# Patient Record
Sex: Female | Born: 1985 | Hispanic: Yes | Marital: Married | Smoking: Never smoker
Health system: Southern US, Community
[De-identification: ages and names within clinical notes are randomized; demographics above are authoritative.]

## PROBLEM LIST (undated history)

## (undated) ENCOUNTER — Inpatient Hospital Stay (HOSPITAL_COMMUNITY): Payer: Self-pay

## (undated) DIAGNOSIS — B977 Papillomavirus as the cause of diseases classified elsewhere: Secondary | ICD-10-CM

## (undated) HISTORY — PX: COLPOSCOPY: SHX161

---

## 2014-09-06 ENCOUNTER — Inpatient Hospital Stay (HOSPITAL_COMMUNITY): Payer: Self-pay

## 2014-09-06 ENCOUNTER — Encounter (HOSPITAL_COMMUNITY): Payer: Self-pay

## 2014-09-06 ENCOUNTER — Inpatient Hospital Stay (HOSPITAL_COMMUNITY)
Admission: AD | Admit: 2014-09-06 | Discharge: 2014-09-06 | Disposition: A | Discharge status: Home / Self Care | Payer: Self-pay | Source: Ambulatory Visit | Attending: Obstetrics & Gynecology | Admitting: Obstetrics & Gynecology

## 2014-09-06 DIAGNOSIS — O209 Hemorrhage in early pregnancy, unspecified: Secondary | ICD-10-CM | POA: Insufficient documentation

## 2014-09-06 DIAGNOSIS — K5900 Constipation, unspecified: Secondary | ICD-10-CM | POA: Insufficient documentation

## 2014-09-06 DIAGNOSIS — Z3A01 Less than 8 weeks gestation of pregnancy: Secondary | ICD-10-CM | POA: Insufficient documentation

## 2014-09-06 HISTORY — DX: Papillomavirus as the cause of diseases classified elsewhere: B97.7

## 2014-09-06 LAB — WET PREP, GENITAL
Clue Cells Wet Prep HPF POC: NONE SEEN
TRICH WET PREP: NONE SEEN
Yeast Wet Prep HPF POC: NONE SEEN

## 2014-09-06 LAB — CBC WITH DIFFERENTIAL/PLATELET
BASOS ABS: 0 10*3/uL (ref 0.0–0.1)
BASOS PCT: 0 % (ref 0–1)
Eosinophils Absolute: 1.1 10*3/uL — ABNORMAL HIGH (ref 0.0–0.7)
Eosinophils Relative: 10 % — ABNORMAL HIGH (ref 0–5)
HCT: 37.2 % (ref 36.0–46.0)
Hemoglobin: 12.9 g/dL (ref 12.0–15.0)
LYMPHS PCT: 26 % (ref 12–46)
Lymphs Abs: 3 10*3/uL (ref 0.7–4.0)
MCH: 29.9 pg (ref 26.0–34.0)
MCHC: 34.7 g/dL (ref 30.0–36.0)
MCV: 86.3 fL (ref 78.0–100.0)
Monocytes Absolute: 0.7 10*3/uL (ref 0.1–1.0)
Monocytes Relative: 6 % (ref 3–12)
NEUTROS PCT: 58 % (ref 43–77)
Neutro Abs: 6.9 10*3/uL (ref 1.7–7.7)
PLATELETS: 223 10*3/uL (ref 150–400)
RBC: 4.31 MIL/uL (ref 3.87–5.11)
RDW: 12.8 % (ref 11.5–15.5)
WBC: 11.8 10*3/uL — AB (ref 4.0–10.5)

## 2014-09-06 LAB — HCG, QUANTITATIVE, PREGNANCY: HCG, BETA CHAIN, QUANT, S: 5223 m[IU]/mL — AB (ref <5)

## 2014-09-06 LAB — URINALYSIS, ROUTINE W REFLEX MICROSCOPIC
BILIRUBIN URINE: NEGATIVE
Glucose, UA: NEGATIVE mg/dL
Ketones, ur: NEGATIVE mg/dL
Nitrite: NEGATIVE
PH: 5.5 (ref 5.0–8.0)
Protein, ur: NEGATIVE mg/dL
UROBILINOGEN UA: 0.2 mg/dL (ref 0.0–1.0)

## 2014-09-06 LAB — URINE MICROSCOPIC-ADD ON

## 2014-09-06 LAB — POCT PREGNANCY, URINE: PREG TEST UR: POSITIVE — AB

## 2014-09-06 NOTE — MAU Note (Signed)
Pt presents complaining of lower abdominal pain and spotting that started 2 months ago. Seen at [facility] 6/15 and was told to come to MAU if it gets worse. LMP 07/09/2014.

## 2014-09-06 NOTE — Discharge Instructions (Signed)
Reposo pélvico  °(Pelvic Rest) °El reposo pélvico se recomienda a las mujeres cuando:  °· La placenta cubre parcial o completamente la abertura del cuello del útero (placenta previa). °· Hay sangrado entre la pared del útero y el saco amniótico en el primer trimestre (hemorragia subcoriónica). °· El cuello uterino comienza a abrirse sin iniciarse el trabajo de parto (cuello uterino incompetente, insuficiencia cervical). °· El trabajo de parto se inicia muy pronto (parto prematuro). °INSTRUCCIONES PARA EL CUIDADO EN EL HOGAR  °· No tenga relaciones sexuales, estimulación, ni orgasmos. °· No use tampones, no se haga duchas vaginales ni coloque ningún objeto en la vagina. °· No levante objetos que pesen más de 10 libras (4,5 kg). °· Evite las actividades extenuantes o tensionar los músculos de la pelvis. °SOLICITE ATENCIÓN MÉDICA SI:   °· Tiene un sangrado vaginal durante el embarazo. Considérelo como una posible emergencia. °· Siente cólicos en la zona baja del estómago (más fuertes que los cólicos menstruales). °· Nota flujo vaginal (acuoso, con moco o sangre). °· Siente un dolor en la espalda leve y sordo. °· Tiene contracciones regulares o endurecimiento del útero. °SOLICITE ATENCIÓN MÉDICA DE INMEDIATO SI:  °Observa sangrado vaginal y tiene placenta previa.  °Document Released: 11/21/2011 °ExitCare® Patient Information ©2015 [employer]. This information is not intended to replace advice given to you by your health care provider. Make sure you discuss any questions you have with your health care provider. °Hemorragia vaginal durante el embarazo (primer trimestre) °(Vaginal Bleeding During Pregnancy, First Trimester) °Durante los primeros meses de embarazo, es común tener una pequeña hemorragia vaginal (manchas). A veces, la hemorragia es normal y no representa un problema, pero en algunas ocasiones es un síntoma de algo grave. Asegúrese de decirle a su médico de inmediato si tiene algún tipo de hemorragia  vaginal. °CUIDADOS EN EL HOGAR °· Controle su afección para ver si hay cambios. °· Siga las indicaciones de su médico con respecto al grado de actividad que puede tener. °· Si debe hacer reposo en cama: °· Es posible que deba quedarse en cama y levantarse únicamente para ir al baño. °· Quizás le permitan hacer algunas actividades. °· Si es necesario, planifique que alguien la ayude. °· Escriba: °· La cantidad de toallas higiénicas que usa cada día. °· La frecuencia con la que se cambia las toallas higiénicas. °· Indique que tan empapados (saturados) están. °· No use tampones. °· No se haga duchas vaginales. °· No tenga relaciones sexuales ni orgasmos hasta que el médico la autorice. °· Si elimina tejido por la vagina, guárdelo para mostrárselo al médico. °· Tome los medicamentos solamente como se lo haya indicado el médico. °· No tome aspirina, ya que puede causar hemorragias. °· Concurra a todas las visitas de control como se lo haya indicado el médico. °SOLICITE AYUDA SI:  °· Tiene una hemorragia vaginal. °· Tiene cólicos. °· Tiene dolores de parto. °· Tiene fiebre que no desaparece después de tomar medicamentos. °SOLICITE AYUDA DE INMEDIATO SI:  °· Siente cólicos muy intensos en la espalda o en el vientre (abdomen). °· Elimina coágulos grandes o tejido por la vagina. °· Tiene más hemorragia. °· Se siente débil o que va a desvanecerse. °· Pierde el conocimiento (se desmaya). °· Tiene escalofríos. °· Tiene una pérdida importante o sale líquido a borbotones por la vagina. °· Se desmaya mientras defeca. °ASEGÚRESE DE QUE: °· Comprende estas instrucciones. °· Controlará su afección. °· Recibirá ayuda de inmediato si no mejora o si empeora. °Document Released: 07/13/2013 °ExitCare® Patient   Information ©2015 [employer]. This information is not intended to replace advice given to you by your health care provider. Make sure you discuss any questions you have with your health care provider. ° °

## 2014-09-06 NOTE — MAU Provider Note (Signed)
History     CSN: [id number]  Arrival date and time: 09/06/14 2014   First Provider Initiated Contact with Patient 09/06/14 2046      Chief Complaint  Patient presents with  . Abdominal Pain  . Vaginal Bleeding   HPI  Ms. Baltazar Apolsa U Martinez is a 29 y.o. G3P2002 at 5852w3d who presents to MAU today with complaint of vaginal bleeding and lower abdominal cramping. The states that bleeding started yesterday. She noted bleeding only with wiping. She also endorses associated lower abdominal cramping rated at 4/10 now. She has not taken any pain medication. She denies UTI symptoms, fever or N/V/D. She does endorse occasional constipation.   OB History    Gravida Para Term Preterm AB TAB SAB Ectopic Multiple Living   3 2 2       2       Past Medical History  Diagnosis Date  . HPV (human papilloma virus) infection     History reviewed. No pertinent past surgical history.  History reviewed. No pertinent family history.  History  Substance Use Topics  . Smoking status: Never Smoker   . Smokeless tobacco: Not on file  . Alcohol Use: No    Allergies: No Known Allergies  No prescriptions prior to admission    Review of Systems  Constitutional: Negative for fever and malaise/fatigue.  Gastrointestinal: Positive for abdominal pain. Negative for nausea, vomiting, diarrhea and constipation.  Genitourinary: Negative for dysuria, urgency and frequency.       + vaginal bleeding   Physical Exam   Blood pressure 122/70, pulse 71, temperature 98 F (36.7 C), temperature source Oral, resp. rate 18, last menstrual period 07/09/2014.  Physical Exam  Nursing note and vitals reviewed. Constitutional: She is oriented to person, place, and time. She appears well-developed and well-nourished. No distress.  HENT:  Head: Normocephalic and atraumatic.  Cardiovascular: Normal rate.   Respiratory: Effort normal.  GI: Soft. She exhibits no distension and no mass. There is tenderness (mild diffuse  tenderness to palpation). There is no rebound and no guarding.  Genitourinary: Uterus is enlarged and tender (mild). Cervix exhibits no motion tenderness, no discharge and no friability. Right adnexum displays no mass and no tenderness. Left adnexum displays no mass and no tenderness. There is bleeding (scant blood noted in the vaginal vault) in the vagina. No vaginal discharge found.  Cervix: closed, thick  Neurological: She is alert and oriented to person, place, and time.  Skin: Skin is warm and dry. No erythema.  Psychiatric: She has a normal mood and affect.   Results for orders placed or performed during the hospital encounter of 09/06/14 (from the past 24 hour(s))  Urinalysis, Routine w reflex microscopic ([facility])     Status: Abnormal   Collection Time: 09/06/14  8:34 PM  Result Value Ref Range   Color, Urine YELLOW YELLOW   APPearance CLEAR CLEAR   Specific Gravity, Urine <1.005 (L) 1.005 - 1.030   pH 5.5 5.0 - 8.0   Glucose, UA NEGATIVE NEGATIVE mg/dL   Hgb urine dipstick MODERATE (A) NEGATIVE   Bilirubin Urine NEGATIVE NEGATIVE   Ketones, ur NEGATIVE NEGATIVE mg/dL   Protein, ur NEGATIVE NEGATIVE mg/dL   Urobilinogen, UA 0.2 0.0 - 1.0 mg/dL   Nitrite NEGATIVE NEGATIVE   Leukocytes, UA SMALL (A) NEGATIVE  Urine microscopic-add on     Status: Abnormal   Collection Time: 09/06/14  8:34 PM  Result Value Ref Range   Squamous Epithelial / LPF FEW (A)  RARE   WBC, UA 3-6 <3 WBC/hpf   RBC / HPF 0-2 <3 RBC/hpf   Bacteria, UA FEW (A) RARE  Pregnancy, urine POC     Status: Abnormal   Collection Time: 09/06/14  8:41 PM  Result Value Ref Range   Preg Test, Ur POSITIVE (A) NEGATIVE  Wet prep, genital     Status: Abnormal   Collection Time: 09/06/14  8:55 PM  Result Value Ref Range   Yeast Wet Prep HPF POC NONE SEEN NONE SEEN   Trich, Wet Prep NONE SEEN NONE SEEN   Clue Cells Wet Prep HPF POC NONE SEEN NONE SEEN   WBC, Wet Prep HPF POC MODERATE (A) NONE SEEN  CBC with  Differential/Platelet     Status: Abnormal   Collection Time: 09/06/14  9:00 PM  Result Value Ref Range   WBC 11.8 (H) 4.0 - 10.5 K/uL   RBC 4.31 3.87 - 5.11 MIL/uL   Hemoglobin 12.9 12.0 - 15.0 g/dL   HCT 16.1 09.6 - 04.5 %   MCV 86.3 78.0 - 100.0 fL   MCH 29.9 26.0 - 34.0 pg   MCHC 34.7 30.0 - 36.0 g/dL   RDW 40.9 81.1 - 91.4 %   Platelets 223 150 - 400 K/uL   Neutrophils Relative % 58 43 - 77 %   Neutro Abs 6.9 1.7 - 7.7 K/uL   Lymphocytes Relative 26 12 - 46 %   Lymphs Abs 3.0 0.7 - 4.0 K/uL   Monocytes Relative 6 3 - 12 %   Monocytes Absolute 0.7 0.1 - 1.0 K/uL   Eosinophils Relative 10 (H) 0 - 5 %   Eosinophils Absolute 1.1 (H) 0.0 - 0.7 K/uL   Basophils Relative 0 0 - 1 %   Basophils Absolute 0.0 0.0 - 0.1 K/uL  ABO/Rh     Status: None (Preliminary result)   Collection Time: 09/06/14  9:00 PM  Result Value Ref Range   ABO/RH(D) O POS   hCG, quantitative, pregnancy     Status: Abnormal   Collection Time: 09/06/14  9:00 PM  Result Value Ref Range   hCG, Beta Chain, Quant, S 5223 (H) <5 mIU/mL   US Ob Comp Less 14 Wks  09/06/2014   CLINICAL DATA:  Bleeding and lower abdominal pain, onset yesterday  EXAM: OBSTETRIC <14 WK [place] AND TRANSVAGINAL OB US  TECHNIQUE: Both transabdominal and transvaginal ultrasound examinations were performed for complete evaluation of the gestation as well as the maternal uterus, adnexal regions, and pelvic cul-de-sac. Transvaginal technique was performed to assess early pregnancy.  COMPARISON:  None.  FINDINGS: Intrauterine gestational sac: Single. Mildly irregular morphology. Poor decidual reaction.  Yolk sac:  Enlarged, 10.1 mm  Embryo:  Yes  Cardiac Activity: No  MSD: 18.4  mm   6 w   6  d  CRL:  4  mm   6 w   1 d                  [place] EDC: 05/01/2015  Maternal uterus/adnexae: Normal  IMPRESSION: Findings are suspicious but not yet definitive for failed pregnancy. Recommend follow-up US in 10-14 days for definitive diagnosis. This recommendation follows  SRU consensus guidelines: Diagnostic Criteria for Nonviable Pregnancy Early in the First Trimester. Malva Limes Med 2013; 782:9562-13.   Electronically Signed   By: Ellery Plunk M.D.   On: 09/06/2014 22:05   US Ob Transvaginal  09/06/2014   CLINICAL DATA:  Bleeding and lower abdominal pain,  onset yesterday  EXAM: OBSTETRIC <14 WK [place] AND TRANSVAGINAL OB US  TECHNIQUE: Both transabdominal and transvaginal ultrasound examinations were performed for complete evaluation of the gestation as well as the maternal uterus, adnexal regions, and pelvic cul-de-sac. Transvaginal technique was performed to assess early pregnancy.  COMPARISON:  None.  FINDINGS: Intrauterine gestational sac: Single. Mildly irregular morphology. Poor decidual reaction.  Yolk sac:  Enlarged, 10.1 mm  Embryo:  Yes  Cardiac Activity: No  MSD: 18.4  mm   6 w   6  d  CRL:  4  mm   6 w   1 d                  [place] EDC: 05/01/2015  Maternal uterus/adnexae: Normal  IMPRESSION: Findings are suspicious but not yet definitive for failed pregnancy. Recommend follow-up US in 10-14 days for definitive diagnosis. This recommendation follows SRU consensus guidelines: Diagnostic Criteria for Nonviable Pregnancy Early in the First Trimester. Malva Limes Med 2013; 409:8119-14.   Electronically Signed   By: Ellery Plunk M.D.   On: 09/06/2014 22:05    MAU Course  Procedures None  MDM +UPT UA, wet prep, GC/chlamydia, CBC, ABO/Rh, quant hCG, HIV, RPR and [place] today to rule out ectopic pregnancy  Assessment and Plan  A: IUGS and YS at [redacted]w[redacted]d Vaginal bleeding in pregnancy Constipation  P: Discharge home Bleeding precautions discussed OTC medications for constipation discussed Patient advised to follow-up with [facility]- [place] for repeat US to confirm viability vs Missed AB on 09/15/14. Patient will return to MAU for results after [place] Patient may return to MAU as needed or if her condition were to change or worsen   Marny Lowenstein, PA-C  09/06/2014, 10:16 PM

## 2014-09-07 ENCOUNTER — Encounter (HOSPITAL_COMMUNITY): Payer: Self-pay

## 2014-09-07 ENCOUNTER — Inpatient Hospital Stay (HOSPITAL_COMMUNITY)
Admission: AD | Admit: 2014-09-07 | Discharge: 2014-09-08 | Disposition: A | Discharge status: Home / Self Care | Payer: Self-pay | Source: Ambulatory Visit | Attending: Family Medicine | Admitting: Family Medicine

## 2014-09-07 DIAGNOSIS — O034 Incomplete spontaneous abortion without complication: Secondary | ICD-10-CM | POA: Insufficient documentation

## 2014-09-07 DIAGNOSIS — O4690 Antepartum hemorrhage, unspecified, unspecified trimester: Secondary | ICD-10-CM

## 2014-09-07 DIAGNOSIS — O039 Complete or unspecified spontaneous abortion without complication: Secondary | ICD-10-CM

## 2014-09-07 LAB — CBC WITH DIFFERENTIAL/PLATELET
BASOS PCT: 0 % (ref 0–1)
Basophils Absolute: 0 10*3/uL (ref 0.0–0.1)
EOS ABS: 1.2 10*3/uL — AB (ref 0.0–0.7)
Eosinophils Relative: 9 % — ABNORMAL HIGH (ref 0–5)
HCT: 37.2 % (ref 36.0–46.0)
Hemoglobin: 12.8 g/dL (ref 12.0–15.0)
Lymphocytes Relative: 22 % (ref 12–46)
Lymphs Abs: 3.1 10*3/uL (ref 0.7–4.0)
MCH: 29.8 pg (ref 26.0–34.0)
MCHC: 34.4 g/dL (ref 30.0–36.0)
MCV: 86.5 fL (ref 78.0–100.0)
MONO ABS: 1.1 10*3/uL — AB (ref 0.1–1.0)
MONOS PCT: 8 % (ref 3–12)
NEUTROS PCT: 62 % (ref 43–77)
Neutro Abs: 8.5 10*3/uL — ABNORMAL HIGH (ref 1.7–7.7)
Platelets: 236 10*3/uL (ref 150–400)
RBC: 4.3 MIL/uL (ref 3.87–5.11)
RDW: 13 % (ref 11.5–15.5)
WBC: 13.9 10*3/uL — ABNORMAL HIGH (ref 4.0–10.5)

## 2014-09-07 LAB — GC/CHLAMYDIA PROBE AMP (~~LOC~~) NOT AT ARMC
Chlamydia: NEGATIVE
NEISSERIA GONORRHEA: NEGATIVE

## 2014-09-07 LAB — RPR: RPR Ser Ql: NONREACTIVE

## 2014-09-07 LAB — HIV ANTIBODY (ROUTINE TESTING W REFLEX): HIV Screen 4th Generation wRfx: NONREACTIVE

## 2014-09-07 LAB — HCG, QUANTITATIVE, PREGNANCY: HCG, BETA CHAIN, QUANT, S: 3642 m[IU]/mL — AB (ref <5)

## 2014-09-07 LAB — ABO/RH: ABO/RH(D): O POS

## 2014-09-07 MED ORDER — OXYCODONE-ACETAMINOPHEN 5-325 MG PO TABS: 1.0000 | ORAL_TABLET | Freq: Four times a day (QID) | ORAL | Status: DC | PRN

## 2014-09-07 NOTE — Discharge Instructions (Signed)
Aborto incompleto °(Incomplete Miscarriage) °Un aborto espontáneo es la pérdida repentina de un bebé en gestación (feto) antes de la semana 20 del embarazo. En un aborto espontáneo, partes del feto o la placenta (alumbramiento) permanecen en el cuerpo.  °El aborto espontáneo puede ser una experiencia que afecte emocionalmente a la persona. Hable con su médico si tiene preguntas sobre el aborto espontáneo, el proceso de duelo y los planes futuros de embarazo. °CAUSAS  °· Algunos problemas cromosómicos pueden hacer imposible que el bebé se desarrolle normalmente. Los problemas con los genes o cromosomas del bebé son, en la mayoría de los casos, el resultado de errores que se producen, al azar, cuando el embrión se divide y crece. Estos problemas no se heredan de los padres. °· Infección en el cuello del útero. °· Problemas hormonales. °· Problemas en el cuello del útero, como tener un útero incompetente. Esto ocurre cuando los tejidos no son lo suficientemente fuertes como para contener el embarazo. °· Problemas del útero, como un útero con forma anormal, los fibromas o anormalidades congénitas. °· Ciertas enfermedades crónicas. °· No fume, no beba alcohol, ni consuma drogas. °· Traumatismos. °SÍNTOMAS  °· Sangrado o manchado vaginal, con o sin cólicos o dolor. °· Dolor o cólicos en el abdomen o en la cintura. °· Eliminación de líquido, tejidos o coágulos grandes por la vagina. °DIAGNÓSTICO  °El médico le hará un examen físico. También le indicará una ecografía para confirmar el aborto. Es posible que se realicen análisis de sangre. °TRATAMIENTO  °· Generalmente se realiza un procedimiento de dilatación y curetaje (D y C). Durante el procedimiento de dilatación y curetaje, el cuello del útero se abre (dilata) y se retira todo resto de tejido fetal o placentario del útero. °· Si hay una infección, le recetarán antibióticos. Posiblemente le receten otros medicamentos para reducir (contraer) el tamaño del útero si hay  mucha hemorragia. °· Si su tipo de sangre es Rh negativo y el del bebé es Rh positivo, necesitará una inyección de inmunoglobulina Rho(D). Esta inyección protegerá a los futuros bebés de tener problemas de compatibilidad Rh en futuros embarazos. °· Probablemente le indiquen reposo. Esto significa que debe quedarse en cama y levantarse únicamente para ir al baño. °INSTRUCCIONES PARA EL CUIDADO EN EL HOGAR  °· Haga reposo según las indicaciones del médico. °· Limite las actividades según las indicaciones del médico. Es posible que se le permita retomar las actividades livianas si no se le realizó un curetaje, pero necesitará tratamiento adicional. °· Lleve un registro de la cantidad de toallas sanitarias que usa por día. Observe cuán impregnadas (saturadas) están. Registre esta información. °· No  use tampones. °· No se haga duchas vaginales ni tenga relaciones sexuales hasta que el médico la autorice. °· Asista a todas las citas de seguimiento para una nueva evaluación y para continuar el tratamiento. °· Sólo tome medicamentos de venta libre o recetados para calmar el dolor, el malestar o bajar la fiebre, según las indicaciones de su médico. °· Tome los antibióticos como le indicó el médico. Asegúrese de que finaliza la prescripción completa aunque se sienta mejor. °SOLICITE ATENCIÓN MÉDICA DE INMEDIATO SI:  °· Siente calambres intensos en el estómago, en la espalda o en el abdomen. °· Le sube la fiebre sin motivo (asegúrese de registrar las cifras). °· Elimina coágulos grandes o tejidos (consérvelos para que el médico los analice). °· La hemorragia aumenta. °· Se siente mareada, débil o tiene episodios de desmayo. °ASEGÚRESE DE QUE:  °· Comprende estas instrucciones. °·   Controlará su afección. °· Recibirá ayuda de inmediato si no mejora o si empeora. °Document Released: 02/26/2005 Document Revised: 12/17/2012 °ExitCare® Patient Information ©2015 [employer]. This information is not intended to replace advice given  to you by your health care provider. Make sure you discuss any questions you have with your health care provider. ° °

## 2014-09-07 NOTE — MAU Note (Signed)
PT  SAYS WITH  VIRIA  - INTERPRETER-    SHE WAS HERE  LAST  NIGHT -  SAYS BLEEDING  BECAME  WORSE  AT 7PM-  SAYS LARGE HANDFUL  SIZE CLOT  CAME  OUT-   IN TRIAGE    PAD ON --  MOD  AMT  RED  VAG BLEEDING.     PAIN WORSE  TONIGHT .

## 2014-09-07 NOTE — Progress Notes (Signed)
I assisted Rn with some questions, also I assisted Diplomatic Services operational officerDeborah RN in triage, by Orlan LeavensViria Alvarez Spanish Interpreter.

## 2014-09-07 NOTE — MAU Provider Note (Signed)
History     CSN: [id number]  Arrival date and time: 09/07/14 2102   First Provider Initiated Contact with Patient 09/07/14 2231      Chief Complaint  Patient presents with  . Vaginal Bleeding  . Abdominal Cramping   HPI  Ms. Cassie Mcconnell is a 29 y.o. G3P2002 at [redacted]w[redacted]d who presents to MAU today with complaint of increased vaginal bleeding. Patient was seen in MAU last night with onset of bleeding. She states that today bleeding became much heavier and she was passing large clots. She states lower abdominal pain associated with bleeding. Pain is rated 4/10 now and 7/10 with passing clots. She denies fever. At visit yesterday US showed IUGS and YS, but IUGS was irregular shaped and measuring ~ 2 weeks less than LMP dating.   OB History    Gravida Para Term Preterm AB TAB SAB Ectopic Multiple Living   Past Medical History  Diagnosis Date  . HPV (human papilloma virus) infection     History reviewed. No pertinent past surgical history.  History reviewed. No pertinent family history.  History  Substance Use Topics  . Smoking status: Never Smoker   . Smokeless tobacco: Not on file  . Alcohol Use: No    Allergies: No Known Allergies  No prescriptions prior to admission    Review of Systems  Constitutional: Negative for fever and malaise/fatigue.  Gastrointestinal: Positive for abdominal pain.  Genitourinary:       + vaginal bleeding   Physical Exam   Blood pressure 112/71, pulse 66, temperature 98.8 F (37.1 C), temperature source Oral, resp. rate 16, height  (1.448 m), weight 151 lb (68.493 kg), last menstrual period 07/09/2014.  Physical Exam  Nursing note and vitals reviewed. Constitutional: She is oriented to person, place, and time. She appears well-developed and well-nourished. No distress.  HENT:  Head: Normocephalic and atraumatic.  Cardiovascular: Normal rate.   Respiratory: Effort normal.  GI: Soft. She exhibits no distension  and no mass. There is no tenderness. There is no rebound and no guarding.  Genitourinary: Uterus is not enlarged and not tender. Cervix exhibits no motion tenderness, no discharge and no friability. Right adnexum displays no mass and no tenderness. Left adnexum displays no mass and no tenderness. There is bleeding (small amount of blood noted in the vaginal vault) in the vagina. No vaginal discharge found.  Cervix: closed, thick  Neurological: She is alert and oriented to person, place, and time.  Skin: Skin is warm and dry. No erythema.  Psychiatric: She has a normal mood and affect.    Results for orders placed or performed during the hospital encounter of 09/07/14 (from the past 24 hour(s))  CBC with Differential/Platelet     Status: Abnormal   Collection Time: 09/07/14 10:02 PM  Result Value Ref Range   WBC 13.9 (H) 4.0 - 10.5 K/uL   RBC 4.30 3.87 - 5.11 MIL/uL   Hemoglobin 12.8 12.0 - 15.0 g/dL   HCT 62.1 30.8 - 65.7 %   MCV 86.5 78.0 - 100.0 fL   MCH 29.8 26.0 - 34.0 pg   MCHC 34.4 30.0 - 36.0 g/dL   RDW 84.6 96.2 - 95.2 %   Platelets 236 150 - 400 K/uL   Neutrophils Relative % 62 43 - 77 %   Neutro Abs 8.5 (H) 1.7 - 7.7 K/uL   Lymphocytes Relative 22 12 - 46 %  Lymphs Abs 3.1 0.7 - 4.0 K/uL   Monocytes Relative 8 3 - 12 %   Monocytes Absolute 1.1 (H) 0.1 - 1.0 K/uL   Eosinophils Relative 9 (H) 0 - 5 %   Eosinophils Absolute 1.2 (H) 0.0 - 0.7 K/uL   Basophils Relative 0 0 - 1 %   Basophils Absolute 0.0 0.0 - 0.1 K/uL  hCG, quantitative, pregnancy     Status: Abnormal   Collection Time: 09/07/14 10:02 PM  Result Value Ref Range   hCG, Beta Chain, Quant, S 3642 (H) <5 mIU/mL   Results for Baltazar ApoMARTINEZ, Cassie U (MRN [id number]) as of 09/08/2014 03:30  Ref. Range 09/06/2014 21:00 09/06/2014 21:56 09/07/2014 22:02  HCG, Beta Chain, Quant, S Latest Ref Range: <5 mIU/mL 5223 (H)  3642 (H)   MAU Course  Procedures None  MDM CBC, quant hCG today Decline in hCG suggestive of SAB in  progress Patient counseled on options of expectant management vs Cytotec Patient prefers expectant management at this time Comfort care package given  Assessment and Plan  A: SAB, incomplete  P: Discharge home Rx for Percocet given to patient Bleeding precautions discussed [facility], they will call with an appiontment in ~ 2 weeks for follow-up Patient may return to MAU as needed or if her condition were to change or worsen  Marny LowensteinJulie N Silus Lanzo, PA-C  09/08/2014, 3:31 AM

## 2014-09-14 ENCOUNTER — Encounter: Payer: Self-pay | Admitting: Family Medicine

## 2014-09-20 ENCOUNTER — Ambulatory Visit (INDEPENDENT_AMBULATORY_CARE_PROVIDER_SITE_OTHER): Payer: Self-pay | Admitting: Family Medicine

## 2014-09-20 ENCOUNTER — Encounter: Payer: Self-pay | Admitting: Family Medicine

## 2014-09-20 VITALS — BP 106/50 | HR 65 | Temp 98.2°F | Ht <= 58 in | Wt 150.4 lb

## 2014-09-20 DIAGNOSIS — O209 Hemorrhage in early pregnancy, unspecified: Secondary | ICD-10-CM | POA: Insufficient documentation

## 2014-09-20 DIAGNOSIS — O034 Incomplete spontaneous abortion without complication: Secondary | ICD-10-CM

## 2014-09-20 DIAGNOSIS — O200 Threatened abortion: Secondary | ICD-10-CM | POA: Insufficient documentation

## 2014-09-20 LAB — CBC
HCT: 35.9 % — ABNORMAL LOW (ref 36.0–46.0)
Hemoglobin: 12.3 g/dL (ref 12.0–15.0)
MCH: 29.8 pg (ref 26.0–34.0)
MCHC: 34.3 g/dL (ref 30.0–36.0)
MCV: 86.9 fL (ref 78.0–100.0)
MPV: 9 fL (ref 8.6–12.4)
PLATELETS: 240 10*3/uL (ref 150–400)
RBC: 4.13 MIL/uL (ref 3.87–5.11)
RDW: 12.9 % (ref 11.5–15.5)
WBC: 8.6 10*3/uL (ref 4.0–10.5)

## 2014-09-20 MED ORDER — OXYCODONE-ACETAMINOPHEN 5-325 MG PO TABS: 1.0000 | ORAL_TABLET | Freq: Four times a day (QID) | ORAL | Status: DC | PRN

## 2014-09-20 NOTE — Assessment & Plan Note (Signed)
Falling bHCG and 6wk fetus at 10wk consistent with missed AB/Incomplete AB.  - will get bhcg today, if continuing to declines plan for cytotec - Currently with dizziness/SOB- will get CBC to determine if patient has developed acute blood loss anemia - refill percocet #12 pills for abdominal pain/cramping

## 2014-09-20 NOTE — Progress Notes (Signed)
Subjective:    Patient ID: Cassie Mcconnell, female    DOB: 06/15/1985, 29 y.o.   MRN: [id number]  HPI Patient is 29 y.o. [id number] here with complaints of vaginal bleeding with falling quant. Sh has been bleeding consistently since 6/28. She reports that over the weekend (saturday) she started having dizziness and SOB especially with movement.  Patient is O POS, last Hgb was on 6/28 and was 12.8.  Lab Results  Component Value Date   HCGBETAQNT 3642* 09/07/2014   HCGBETAQNT 5223* 09/06/2014    Review of Systems  Constitutional: Negative for chills, diaphoresis and fatigue.  Respiratory: Positive for shortness of breath. Negative for cough.   Cardiovascular: Negative for leg swelling.  Gastrointestinal: Positive for abdominal pain (lower abdomen). Negative for diarrhea, constipation and anal bleeding.  Endocrine: Negative for cold intolerance and heat intolerance.  Genitourinary: Positive for vaginal bleeding. Negative for dysuria, urgency, decreased urine volume and difficulty urinating.  Neurological: Positive for dizziness and light-headedness. Negative for headaches.      Objective:   Physical Exam  Constitutional: She appears well-developed and well-nourished. No distress.  HENT:  Mouth/Throat: Mucous membranes are moist. Pharynx is normal.  Eyes: Conjunctivae and EOM are normal.  Neck: No adenopathy.  Cardiovascular: Regular rhythm and S2 normal.   Pulmonary/Chest: Effort normal and breath sounds normal.  Abdominal: She exhibits no distension. There is no tenderness.  Genitourinary: Vagina normal. Cervix exhibits no motion tenderness. Right adnexum displays no tenderness and no fullness. Left adnexum displays no tenderness and no fullness.  Copious blood in vaginal vault with closed appearing, cervix. No CMT. SVE, closed.   Musculoskeletal: Normal range of motion.  Neurological: She is alert. No cranial nerve deficit. Coordination normal.  Skin: Skin is warm. No rash  noted. She is not diaphoretic. No pallor.    TECHNIQUE: Both transabdominal and transvaginal ultrasound examinations were performed for complete evaluation of the gestation as well as the maternal uterus, adnexal regions, and pelvic cul-de-sac. Transvaginal technique was performed to assess early pregnancy.  COMPARISON: None.  FINDINGS: Intrauterine gestational sac: Single. Mildly irregular morphology. Poor decidual reaction.  Yolk sac: Enlarged, 10.1 mm  Embryo: Yes  Cardiac Activity: No  MSD: 18.4 mm 6 w 6 d  CRL: 4 mm 6 w 1 d US EDC: 05/01/2015  Maternal uterus/adnexae: Normal  IMPRESSION: Findings are suspicious but not yet definitive for failed pregnancy. Recommend follow-up US in 10-14 days for definitive diagnosis. This recommendation follows SRU consensus guidelines: Diagnostic Criteria for Nonviable Pregnancy Early in the First Trimester. Malva Limes Engl J Med 2013; 147:8295-62; 369:1443-51.     Assessment & Plan:   Problem List Items Addressed This Visit    Threatened abortion in first trimester    Falling bHCG and 6wk fetus at 10wk consistent with missed AB/Incomplete AB.  - will get bhcg today, if continuing to declines plan for cytotec - Currently with dizziness/SOB- will get CBC to determine if patient has developed acute blood loss anemia - refill percocet #12 pills for abdominal pain/cramping      Vaginal bleeding before [redacted] weeks gestation    See threatened AB      Relevant Medications   oxyCODONE-acetaminophen (PERCOCET/ROXICET) 5-325 MG per tablet   Other Relevant Orders   CBC    Other Visit Diagnoses    Incomplete spontaneous abortion    -  Primary    Relevant Medications    oxyCODONE-acetaminophen (PERCOCET/ROXICET) 5-325 MG per tablet    Other Relevant Orders  B-HCG Quant    CBC

## 2014-09-20 NOTE — Assessment & Plan Note (Signed)
See threatened AB

## 2014-09-20 NOTE — Patient Instructions (Signed)
Aborto incompleto °(Incomplete Miscarriage) °Un aborto espontáneo es la pérdida repentina de un bebé en gestación (feto) antes de la semana 20 del embarazo. En un aborto espontáneo, partes del feto o la placenta (alumbramiento) permanecen en el cuerpo.  °El aborto espontáneo puede ser una experiencia que afecte emocionalmente a la persona. Hable con su médico si tiene preguntas sobre el aborto espontáneo, el proceso de duelo y los planes futuros de embarazo. °CAUSAS  °· Algunos problemas cromosómicos pueden hacer imposible que el bebé se desarrolle normalmente. Los problemas con los genes o cromosomas del bebé son, en la mayoría de los casos, el resultado de errores que se producen, al azar, cuando el embrión se divide y crece. Estos problemas no se heredan de los padres. °· Infección en el cuello del útero. °· Problemas hormonales. °· Problemas en el cuello del útero, como tener un útero incompetente. Esto ocurre cuando los tejidos no son lo suficientemente fuertes como para contener el embarazo. °· Problemas del útero, como un útero con forma anormal, los fibromas o anormalidades congénitas. °· Ciertas enfermedades crónicas. °· No fume, no beba alcohol, ni consuma drogas. °· Traumatismos. °SÍNTOMAS  °· Sangrado o manchado vaginal, con o sin cólicos o dolor. °· Dolor o cólicos en el abdomen o en la cintura. °· Eliminación de líquido, tejidos o coágulos grandes por la vagina. °DIAGNÓSTICO  °El médico le hará un examen físico. También le indicará una ecografía para confirmar el aborto. Es posible que se realicen análisis de sangre. °TRATAMIENTO  °· Generalmente se realiza un procedimiento de dilatación y curetaje (D y C). Durante el procedimiento de dilatación y curetaje, el cuello del útero se abre (dilata) y se retira todo resto de tejido fetal o placentario del útero. °· Si hay una infección, le recetarán antibióticos. Posiblemente le receten otros medicamentos para reducir (contraer) el tamaño del útero si hay  mucha hemorragia. °· Si su tipo de sangre es Rh negativo y el del bebé es Rh positivo, necesitará una inyección de inmunoglobulina Rho(D). Esta inyección protegerá a los futuros bebés de tener problemas de compatibilidad Rh en futuros embarazos. °· Probablemente le indiquen reposo. Esto significa que debe quedarse en cama y levantarse únicamente para ir al baño. °INSTRUCCIONES PARA EL CUIDADO EN EL HOGAR  °· Haga reposo según las indicaciones del médico. °· Limite las actividades según las indicaciones del médico. Es posible que se le permita retomar las actividades livianas si no se le realizó un curetaje, pero necesitará tratamiento adicional. °· Lleve un registro de la cantidad de toallas sanitarias que usa por día. Observe cuán impregnadas (saturadas) están. Registre esta información. °· No  use tampones. °· No se haga duchas vaginales ni tenga relaciones sexuales hasta que el médico la autorice. °· Asista a todas las citas de seguimiento para una nueva evaluación y para continuar el tratamiento. °· Sólo tome medicamentos de venta libre o recetados para calmar el dolor, el malestar o bajar la fiebre, según las indicaciones de su médico. °· Tome los antibióticos como le indicó el médico. Asegúrese de que finaliza la prescripción completa aunque se sienta mejor. °SOLICITE ATENCIÓN MÉDICA DE INMEDIATO SI:  °· Siente calambres intensos en el estómago, en la espalda o en el abdomen. °· Le sube la fiebre sin motivo (asegúrese de registrar las cifras). °· Elimina coágulos grandes o tejidos (consérvelos para que el médico los analice). °· La hemorragia aumenta. °· Se siente mareada, débil o tiene episodios de desmayo. °ASEGÚRESE DE QUE:  °· Comprende estas instrucciones. °·   Controlará su afección. °· Recibirá ayuda de inmediato si no mejora o si empeora. °Document Released: 02/26/2005 Document Revised: 12/17/2012 °ExitCare® Patient Information ©2015 [employer]. This information is not intended to replace advice given  to you by your health care provider. Make sure you discuss any questions you have with your health care provider. ° °

## 2014-09-21 ENCOUNTER — Other Ambulatory Visit: Payer: Self-pay | Admitting: Family Medicine

## 2014-09-21 LAB — HCG, QUANTITATIVE, PREGNANCY: hCG, Beta Chain, Quant, S: 21 m[IU]/mL

## 2014-09-21 MED ORDER — MISOPROSTOL 200 MCG PO TABS: 200.0000 ug | ORAL_TABLET | Freq: Once | ORAL | Status: DC

## 2014-09-22 ENCOUNTER — Telehealth: Payer: Self-pay | Admitting: General Practice

## 2014-09-22 NOTE — Telephone Encounter (Signed)
Telephone call to patient regarding bhcg results consistent with SAB and medication at her pharmacy. Called patient with Cassie Mcconnell for interpreter, no answer- left message stating we are trying to reach you with results and to let you know a medication has been sent to your pharmacy. Please call us back at the clinics

## 2014-09-22 NOTE — Telephone Encounter (Signed)
Patient called back to front office. Cassie Mcconnell used for interpreter. Informed patient of bhcg results show SAB and informed her medication at pharmacy for her to take. Discussed with patient that the doctor wanted her to follow up with us in a couple of weeks and that I will let the front office know and they will contact her with an appt. Patient verbalized understanding and had no questions

## 2014-09-27 ENCOUNTER — Telehealth: Payer: Self-pay

## 2014-09-27 DIAGNOSIS — O034 Incomplete spontaneous abortion without complication: Secondary | ICD-10-CM

## 2014-09-27 MED ORDER — MISOPROSTOL 200 MCG PO TABS: 200.0000 ug | ORAL_TABLET | Freq: Once | ORAL | Status: DC

## 2014-09-30 NOTE — Telephone Encounter (Signed)
Patient states her pharmacy did not have medication in store, checked the medication status according to our med orders here and we had a reciept. patient states she will return to pharmacy and if they do not have it she will call us back. Avel Peace Used

## 2014-10-13 ENCOUNTER — Encounter: Payer: Self-pay | Admitting: Obstetrics & Gynecology

## 2014-10-22 ENCOUNTER — Encounter: Payer: Self-pay | Admitting: Family Medicine

## 2014-10-22 ENCOUNTER — Ambulatory Visit (INDEPENDENT_AMBULATORY_CARE_PROVIDER_SITE_OTHER): Payer: Self-pay | Admitting: Family Medicine

## 2014-10-22 VITALS — BP 122/61 | HR 60 | Temp 97.8°F | Ht <= 58 in | Wt 155.0 lb

## 2014-10-22 DIAGNOSIS — O039 Complete or unspecified spontaneous abortion without complication: Secondary | ICD-10-CM

## 2014-10-22 NOTE — Progress Notes (Signed)
Cassie Mcconnell used for [employer]

## 2014-10-22 NOTE — Progress Notes (Signed)
   Subjective:    Patient ID: Cassie Mcconnell, female    DOB: [birth year], 29 y.o.   MRN: [id number]  HPI Patient seen for follow up of SAB.  Had bleeding up until 2 weeks ago.  Bleeding has stopped.  Last appt was 7/11 - had bHCG, which was 21.  Denies pelvic pain, bleeding, cramping, nausea, vomiting.   Review of Systems     Objective:   Physical Exam  Constitutional: She appears well-developed and well-nourished. No distress.  HENT:  Head: Normocephalic and atraumatic.  Cardiovascular: Normal rate and regular rhythm.  Exam reveals no gallop and no friction rub.   No murmur heard. Pulmonary/Chest: Effort normal and breath sounds normal. No respiratory distress. She has no wheezes. She has no rales. She exhibits no tenderness.  Abdominal: Soft. Bowel sounds are normal. She exhibits no distension. There is no tenderness. There is no rebound and no guarding.  Skin: Skin is warm and dry. She is not diaphoretic.      Assessment & Plan:  1. SAB (spontaneous abortion) Discussed should wait 3-6 months prior to trying to get pregnant.  Repeat hCG to confirm resolution.  Pt declined birth control.  F/u prn. - hCG, quantitative, pregnancy

## 2014-10-22 NOTE — Patient Instructions (Signed)
Aborto espontáneo  °(Miscarriage) ° El aborto espontáneo es la pérdida de un bebé que no ha nacido.(feto) antes de la semana 20 del embarazo. La causa generalmente es desconocida.  °CUIDADOS EN EL HOGAR  °· Debe permanecer en cama (reposo en cama) o podrá hacer actividades livianas. Regrese a sus actividades según las indicaciones del médico. °· Pida ayuda con las tareas domésticas. °· Anote cuántos apósitos usa por día. Describa el grado en que están empapados. °· No use tampones. No se higienice la vagina (duchas vaginales) ni tenga relaciones sexuales (coito) hasta que el médico la autorice. °· Sólo debe tomar la medicación según las indicaciones del médico. °· No tome aspirina. °· Cumpla con los controles médicos según las indicaciones. °· Si usted o su pareja tienen problemas con el duelo, hable con su médico. También puede intentar con psicoterapia. Permítase el tiempo suficiente de duelo antes de quedar embarazada nuevamente. °SOLICITE AYUDA DE INMEDIATO SI:  °· Siente cólicos intensos o dolor en el estómago, en la espalda o en el vientre (abdomen). °· Tiene fiebre. °· Elimina grumos de sangre (coágulos) por la vagina, que tienen el tamaño de una nuez o más. Guarde los coágulos para que el médico los vea. °· Elimina gran cantidad de tejidos por la vagina. Guarde lo que ha eliminado para que su médico lo examine. °· Aumenta el sangrado. °· Observa una secreción espesa, con mal olor (pérdida) que proviene de la vagina. °· Se siente mareada, débil o se desvanece (se desmaya). °· Siente escalofríos. °ASEGÚRESE DE QUE:  °· Comprende estas instrucciones. °· Controlará su enfermedad. °· Solicitará ayuda de inmediato si no mejora o si empeora. °Document Released: 08/28/2011 °ExitCare® Patient Information ©2015 [employer]. This information is not intended to replace advice given to you by your health care provider. Make sure you discuss any questions you have with your health care provider. ° °

## 2014-10-23 LAB — HCG, QUANTITATIVE, PREGNANCY: hCG, Beta Chain, Quant, S: <2 m[IU]/mL

## 2014-10-25 ENCOUNTER — Telehealth: Payer: Self-pay

## 2014-10-25 NOTE — Telephone Encounter (Signed)
Per Dr. Adrian Blackwater, pt's hcg levels are normal no need for any other follow up.

## 2014-10-25 NOTE — Telephone Encounter (Signed)
Cassie Mcconnell called back , call transferred to nurse. Used interpreter Nile Riggs and informed her last bhcg normal and does not need any more labs.  She voices understanding. She also states she has started bleeding a little . States prior to that has not bled since miscarriage at end of June.States she normally has regular periods.  We discussed  It is likely her period resuming, but if she continues to have bleeding problems to call for appointment.

## 2015-01-11 ENCOUNTER — Encounter (HOSPITAL_COMMUNITY): Payer: Self-pay | Admitting: *Deleted

## 2015-01-14 ENCOUNTER — Ambulatory Visit (HOSPITAL_COMMUNITY)
Admission: RE | Admit: 2015-01-14 | Discharge: 2015-01-14 | Disposition: A | Discharge status: Home / Self Care | Payer: Self-pay | Source: Ambulatory Visit | Attending: Obstetrics and Gynecology | Admitting: Obstetrics and Gynecology

## 2015-01-14 ENCOUNTER — Encounter (HOSPITAL_COMMUNITY): Payer: Self-pay

## 2015-01-14 VITALS — BP 108/60 | Temp 98.4°F | Ht 60.0 in | Wt 156.0 lb

## 2015-01-14 DIAGNOSIS — R87611 Atypical squamous cells cannot exclude high grade squamous intraepithelial lesion on cytologic smear of cervix (ASC-H): Secondary | ICD-10-CM

## 2015-01-14 DIAGNOSIS — Z1239 Encounter for other screening for malignant neoplasm of breast: Secondary | ICD-10-CM

## 2015-01-14 NOTE — Patient Instructions (Signed)
Educational materials on self breast awareness given. Explained to Cassie Mcconnell the colposcopy that is recommended for follow up for abnormal Pap smear 12/21/2014. [facility] for colposcopy to follow up for abnormal Pap. Appointment scheduled for Wednesday, February 09, 2015 at 1515. Patient aware of appointment and will be there. Screening mammogram recommended at age 29 unless clinically indicated prior. Cassie Mcconnell verbalized understanding.  Tenasia Aull, Kathaleen Maserhristine Poll, RN 11:03 AM

## 2015-01-14 NOTE — Progress Notes (Addendum)
[facility] to follow-up for abnormal Pap smear 12/21/2014.  Pap Smear:  Pap smear not completed today. [facility]. [facility] for colposcopy to follow up for abnormal Pap. Appointment scheduled for Wednesday, February 09, 2015 at 1515. Per patient has no history of an abnormal Pap smear prior to the most recent Pap smear. Pap smear result is scanned in EPIC under media.  Physical exam: Breasts Breasts symmetrical. No skin abnormalities bilateral breasts. No nipple retraction bilateral breasts. No nipple discharge bilateral breasts. No lymphadenopathy. No lumps palpated bilateral breasts. No complaints of pain or tenderness on exam. Screening mammogram recommended at age 29 unless clinically indicated prior.    Pelvic/Bimanual No Pap smear completed today since last Pap smear was 12/21/2014. Pap smear not indicated per [facility] guidelines.   Used interpreter Nira ConnJulia Sowell.

## 2015-01-14 NOTE — Addendum Note (Signed)
Encounter addended by: Priscille Heidelberghristine P Tiawana Forgy, RN on: 01/14/2015 11:13 AM<BR>     Documentation filed: Notes Section

## 2015-02-09 ENCOUNTER — Encounter: Payer: Self-pay | Admitting: Obstetrics and Gynecology

## 2015-02-09 ENCOUNTER — Other Ambulatory Visit (HOSPITAL_COMMUNITY)
Admission: RE | Admit: 2015-02-09 | Discharge: 2015-02-09 | Disposition: A | Discharge status: Home / Self Care | Payer: Self-pay | Source: Ambulatory Visit | Attending: Obstetrics and Gynecology | Admitting: Obstetrics and Gynecology

## 2015-02-09 ENCOUNTER — Ambulatory Visit (INDEPENDENT_AMBULATORY_CARE_PROVIDER_SITE_OTHER): Payer: Self-pay | Admitting: Obstetrics and Gynecology

## 2015-02-09 VITALS — BP 112/66 | HR 69 | Temp 98.4°F | Ht 60.0 in | Wt 153.3 lb

## 2015-02-09 DIAGNOSIS — Z3202 Encounter for pregnancy test, result negative: Secondary | ICD-10-CM

## 2015-02-09 DIAGNOSIS — R87611 Atypical squamous cells cannot exclude high grade squamous intraepithelial lesion on cytologic smear of cervix (ASC-H): Secondary | ICD-10-CM

## 2015-02-09 DIAGNOSIS — N871 Moderate cervical dysplasia: Secondary | ICD-10-CM | POA: Insufficient documentation

## 2015-02-09 DIAGNOSIS — R87619 Unspecified abnormal cytological findings in specimens from cervix uteri: Secondary | ICD-10-CM | POA: Insufficient documentation

## 2015-02-09 DIAGNOSIS — Z01812 Encounter for preprocedural laboratory examination: Secondary | ICD-10-CM

## 2015-02-09 LAB — POCT PREGNANCY, URINE: Preg Test, Ur: NEGATIVE

## 2015-02-09 MED ORDER — IBUPROFEN 800 MG PO TABS
800.0000 mg | ORAL_TABLET | Freq: Once | ORAL | Status: AC
  Administered 2015-02-09: 800 mg via ORAL

## 2015-02-09 NOTE — Progress Notes (Signed)
Patient ID: Cassie Mcconnell, female   DOB: 06/20/1985, 29 y.o.   MRN: [id number]  Patient with [facility] pap smear on 12/21/2014 here for colposcopy Patient given informed consent, signed copy in the chart, time out was performed.  Placed in lithotomy position. Cervix viewed with speculum and colposcope after application of acetic acid.   Colposcopy adequate?  yes Acetowhite lesions?yes 2, 5, and 8 Punctation?no Mosaicism?  no Abnormal vasculature?  no Biopsies?yes 2, 5, and 8 ECC?yes  COMMENTS: Patient was given post procedure instructions.  She will return in 2 weeks for results.  Catalina AntiguaONSTANT,Earma Nicolaou, MD

## 2015-02-09 NOTE — Addendum Note (Signed)
Addended by: Faythe CasaBELLAMY, Jeffrie Stander M on: 02/09/2015 02:01 PM   Modules accepted: Orders

## 2015-02-21 ENCOUNTER — Telehealth: Payer: Self-pay

## 2015-02-21 NOTE — Telephone Encounter (Signed)
Called patient to verify appointment with her only speaks spanish. Patient understood appointment time and date and the reason for that visit.

## 2015-03-10 ENCOUNTER — Encounter: Payer: Self-pay | Admitting: Family Medicine

## 2015-03-13 HISTORY — PX: LEEP: SHX91

## 2015-03-18 ENCOUNTER — Other Ambulatory Visit (HOSPITAL_COMMUNITY)
Admission: RE | Admit: 2015-03-18 | Discharge: 2015-03-18 | Disposition: A | Discharge status: Home / Self Care | Payer: Self-pay | Source: Ambulatory Visit | Attending: Family Medicine | Admitting: Family Medicine

## 2015-03-18 ENCOUNTER — Ambulatory Visit (INDEPENDENT_AMBULATORY_CARE_PROVIDER_SITE_OTHER): Payer: Self-pay | Admitting: Family Medicine

## 2015-03-18 ENCOUNTER — Encounter: Payer: Self-pay | Admitting: Family Medicine

## 2015-03-18 VITALS — BP 105/66 | HR 88 | Temp 98.9°F | Wt 151.0 lb

## 2015-03-18 DIAGNOSIS — N871 Moderate cervical dysplasia: Secondary | ICD-10-CM

## 2015-03-18 LAB — POCT PREGNANCY, URINE: PREG TEST UR: NEGATIVE

## 2015-03-18 NOTE — Progress Notes (Signed)
   LEEP PROCEDURE NOTE Pap smear and colposcopy reviewed.   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  Role of HPV,cervical dysplasia and need for close followup was empasized. Informed written consent was obtained. All questions were answered. Time out performed.  ??Procedure: The patient was placed in lithotomy position and the bivalved coated speculum was placed in the patient's vagina. A grounding pad placed on the patient. Lugol's solution was applied to the cervix and areas of decreased uptake were noted 6-8, 12 o'clock.   Local anesthesia was administered via an intracervical block using 10cc of 2% Lidocaine with epinephrine. The suction was turned on and the Medium 1X Fisher Cone Biopsy Excisor on 50 Watts of cutting current was used to excise the area of decreased uptake and excise the entire transformation zone. Excellent hemostasis was achieved using roller ball coagulation set at 50 Watts coagulation current. Monsel's solution was then applied and the speculum was removed from the vagina. Specimens were sent to pathology. ?The patient tolerated the procedure well. Post-operative instructions given to patient, including instruction to seek medical attention for persistent bright red bleeding, fever, abdominal/pelvic pain, dysuria, nausea or vomiting. She was also told about the possibility of having copious yellow to black tinged discharge. She was counseled to avoid anything in the vagina (sex/douching/tampons) for 4-6 weeks. She has a  4-week post-operative check to review results and assess wound healing.

## 2015-03-18 NOTE — Patient Instructions (Signed)
Procedimiento de escisin electroquirrgica con asa - Cuidados posteriores  (Loop Electrosurgical Excision Procedure, Care After) Siga estas instrucciones durante las prximas semanas. Estas indicaciones le proporcionan informacin general acerca de cmo deber cuidarse despus del procedimiento. El mdico tambin podr darle instrucciones especficas. El tratamiento ha sido planificado segn las prcticas mdicas actuales, pero en algunos casos pueden ocurrir problemas. Comunquese con el mdico si tiene algn problema o tiene preguntas despus del procedimiento.  INSTRUCCIONES PARA EL CUIDADO EN EL HOGAR   No use tampones, no se d duchas vaginales ni tenga relaciones sexuales durante 2 semanas, o segn lo que le indique su mdico.  Comience con las actividades habituales si no tiene o tiene mnimo de clicos y sangrado, excepto que el mdico le indique lo contrario.  Tmese la temperatura si se siente enfermo. Anote la temperatura en un papel e informe a su mdico que tiene fiebre.  Tome todos los medicamentos segn le indic su mdico.  Cumpla con todas las visitas de control y los papanicolau, segn le indique su mdico. SOLICITE ATENCIN MDICA DE INMEDIATO SI:   Tiene un sangrado ms abundante o que dura ms que el ciclo menstrual normal.  Tiene un sangrado de color rojo brillante.  Elimina cogulos de sangre.  Tiene fiebre.  Siente clicos o el dolor no se alivia con la medicacin.  Siente dolor abdominal que no parece estar relacionado con la misma zona en que sinti los clicos y el dolor.  Se siente mareada, dbil o se desmaya.  Comienza a sentir dolor al orinar u observa sangre.  Tiene una secrecin vaginal con mal olor. ASEGRESE DE QUE:   Comprende estas instrucciones.  Controlar su enfermedad.  Solicitar ayuda de inmediato si no mejora o si empeora.   Esta informacin no tiene como fin reemplazar el consejo del mdico. Asegrese de hacerle al mdico cualquier  pregunta que tenga.   Document Released: 11/09/2010 Document Revised: 05/21/2011 Elsevier Interactive Patient Education 2016 [employer]vier Inc.  

## 2015-03-23 ENCOUNTER — Encounter: Payer: Self-pay | Admitting: Family Medicine

## 2015-08-19 ENCOUNTER — Inpatient Hospital Stay (HOSPITAL_COMMUNITY)
Admission: AD | Admit: 2015-08-19 | Discharge: 2015-08-20 | Disposition: A | Discharge status: Home / Self Care | Payer: Self-pay | Source: Ambulatory Visit | Attending: Obstetrics & Gynecology | Admitting: Obstetrics & Gynecology

## 2015-08-19 ENCOUNTER — Encounter (HOSPITAL_COMMUNITY): Payer: Self-pay | Admitting: Medical

## 2015-08-19 ENCOUNTER — Inpatient Hospital Stay (HOSPITAL_COMMUNITY): Payer: Self-pay

## 2015-08-19 DIAGNOSIS — R51 Headache: Secondary | ICD-10-CM | POA: Insufficient documentation

## 2015-08-19 DIAGNOSIS — O218 Other vomiting complicating pregnancy: Secondary | ICD-10-CM | POA: Insufficient documentation

## 2015-08-19 DIAGNOSIS — B3731 Acute candidiasis of vulva and vagina: Secondary | ICD-10-CM

## 2015-08-19 DIAGNOSIS — O219 Vomiting of pregnancy, unspecified: Secondary | ICD-10-CM

## 2015-08-19 DIAGNOSIS — O26899 Other specified pregnancy related conditions, unspecified trimester: Secondary | ICD-10-CM

## 2015-08-19 DIAGNOSIS — R519 Headache, unspecified: Secondary | ICD-10-CM

## 2015-08-19 DIAGNOSIS — R109 Unspecified abdominal pain: Secondary | ICD-10-CM

## 2015-08-19 DIAGNOSIS — Z3A13 13 weeks gestation of pregnancy: Secondary | ICD-10-CM | POA: Insufficient documentation

## 2015-08-19 DIAGNOSIS — O26891 Other specified pregnancy related conditions, first trimester: Secondary | ICD-10-CM | POA: Insufficient documentation

## 2015-08-19 DIAGNOSIS — B373 Candidiasis of vulva and vagina: Secondary | ICD-10-CM | POA: Insufficient documentation

## 2015-08-19 DIAGNOSIS — O98811 Other maternal infectious and parasitic diseases complicating pregnancy, first trimester: Secondary | ICD-10-CM | POA: Insufficient documentation

## 2015-08-19 LAB — URINE MICROSCOPIC-ADD ON: RBC / HPF: NONE SEEN RBC/hpf (ref 0–5)

## 2015-08-19 LAB — CBC WITH DIFFERENTIAL/PLATELET
BASOS ABS: 0 10*3/uL (ref 0.0–0.1)
Basophils Relative: 0 %
EOS PCT: 2 %
Eosinophils Absolute: 0.2 10*3/uL (ref 0.0–0.7)
HEMATOCRIT: 34.3 % — AB (ref 36.0–46.0)
HEMOGLOBIN: 12.2 g/dL (ref 12.0–15.0)
LYMPHS ABS: 2.8 10*3/uL (ref 0.7–4.0)
LYMPHS PCT: 29 %
MCH: 29.5 pg (ref 26.0–34.0)
MCHC: 35.6 g/dL (ref 30.0–36.0)
MCV: 83.1 fL (ref 78.0–100.0)
Monocytes Absolute: 0.4 10*3/uL (ref 0.1–1.0)
Monocytes Relative: 4 %
NEUTROS ABS: 6.3 10*3/uL (ref 1.7–7.7)
NEUTROS PCT: 65 %
Platelets: 216 10*3/uL (ref 150–400)
RBC: 4.13 MIL/uL (ref 3.87–5.11)
RDW: 12.8 % (ref 11.5–15.5)
WBC: 9.7 10*3/uL (ref 4.0–10.5)

## 2015-08-19 LAB — WET PREP, GENITAL
CLUE CELLS WET PREP: NONE SEEN
SPERM: NONE SEEN
Trich, Wet Prep: NONE SEEN

## 2015-08-19 LAB — POCT PREGNANCY, URINE: PREG TEST UR: POSITIVE — AB

## 2015-08-19 LAB — URINALYSIS, ROUTINE W REFLEX MICROSCOPIC
Bilirubin Urine: NEGATIVE
Glucose, UA: NEGATIVE mg/dL
HGB URINE DIPSTICK: NEGATIVE
Ketones, ur: NEGATIVE mg/dL
Nitrite: NEGATIVE
PROTEIN: NEGATIVE mg/dL
Specific Gravity, Urine: 1.01 (ref 1.005–1.030)
pH: 7.5 (ref 5.0–8.0)

## 2015-08-19 MED ORDER — METOCLOPRAMIDE HCL 5 MG/ML IJ SOLN
10.0000 mg | Freq: Once | INTRAMUSCULAR | Status: AC
Start: 2015-08-19 — End: 2015-08-19
  Administered 2015-08-19: 10 mg via INTRAVENOUS
  Filled 2015-08-19: qty 2

## 2015-08-19 MED ORDER — DIPHENHYDRAMINE HCL 50 MG/ML IJ SOLN
25.0000 mg | Freq: Once | INTRAMUSCULAR | Status: AC
  Administered 2015-08-19: 25 mg via INTRAVENOUS
  Filled 2015-08-19: qty 1

## 2015-08-19 MED ORDER — SODIUM CHLORIDE 0.9 % IV BOLUS (SEPSIS)
1000.0000 mL | Freq: Once | INTRAVENOUS | Status: AC
  Administered 2015-08-19: 1000 mL via INTRAVENOUS

## 2015-08-19 MED ORDER — DEXAMETHASONE SODIUM PHOSPHATE 10 MG/ML IJ SOLN
10.0000 mg | Freq: Once | INTRAMUSCULAR | Status: AC
  Administered 2015-08-19: 10 mg via INTRAVENOUS
  Filled 2015-08-19: qty 1

## 2015-08-19 NOTE — MAU Note (Signed)
Pt c/o HA since noon yesterday. Took tylenol at 4pm-helped some. Denies vag bleeding but has occ lower abdominal cramping. LMP: 06/21/2015

## 2015-08-19 NOTE — MAU Provider Note (Signed)
History     CSN: [id number]  Arrival date and time: 08/19/15 2247   First Provider Initiated Contact with Patient 08/19/15 2311      Chief Complaint  Patient presents with  . Headache   HPI Cassie Mcconnell is a 30 y.o. [id number] at 5328w3d who presents to MAU today with complaint of headache, N/V and abdominal pain. The patient states headache all day, unrelieved with Tylenol. Last dose at 1600. She rates headache at 5/10 now. She states mild lower abdominal cramping has been intermittent recently. She denies vaginal bleeding or UTI symptoms. She has had "a lot" of N/V recently, but has not taken any anti-emetics. She denies diarrhea, but endorses mild constipation.   OB History    Gravida Para Term Preterm AB TAB SAB Ectopic Multiple Living   4 2 2  1  1   2       Past Medical History  Diagnosis Date  . HPV (human papilloma virus) infection     Past Surgical History  Procedure Laterality Date  . Colposcopy      Family History  Problem Relation Age of Onset  . Cancer Mother     cervical    Social History  Substance Use Topics  . Smoking status: Never Smoker   . Smokeless tobacco: None  . Alcohol Use: No    Allergies: No Known Allergies  Prescriptions prior to admission  Medication Sig Dispense Refill Last Dose  . acetaminophen (TYLENOL) 500 MG tablet Take 500 mg by mouth every 6 (six) hours as needed.   08/19/2015 at 1600  . levonorgestrel-ethinyl estradiol (NORDETTE) 0.15-30 MG-MCG tablet Take 1 tablet by mouth daily.   Taking    Review of Systems  Constitutional: Negative for fever and malaise/fatigue.  Gastrointestinal: Positive for nausea, vomiting and abdominal pain. Negative for diarrhea and constipation.  Genitourinary: Negative for dysuria, urgency and frequency.       Neg - vaginal bleeding + vaginal discharge  Neurological: Positive for headaches.   Physical Exam   Blood pressure 118/68, pulse 86, temperature 98.8 F (37.1 C), temperature source  Oral, resp. rate 16, height 4\' 10"  (1.473 m), weight 149 lb (67.586 kg), last menstrual period 06/21/2015, SpO2 100 %.  Physical Exam  Nursing note and vitals reviewed. Constitutional: She is oriented to person, place, and time. She appears well-developed and well-nourished. No distress.  HENT:  Head: Normocephalic and atraumatic.  Cardiovascular: Normal rate.   Respiratory: Effort normal.  GI: Soft. She exhibits no distension and no mass. There is no tenderness. There is no rebound and no guarding.  Neurological: She is alert and oriented to person, place, and time.  Skin: Skin is warm and dry. No erythema.  Psychiatric: She has a normal mood and affect.    Results for orders placed or performed during the hospital encounter of 08/19/15 (from the past 24 hour(s))  Urinalysis, Routine w reflex microscopic (not at [facility])     Status: Abnormal   Collection Time: 08/19/15 10:50 PM  Result Value Ref Range   Color, Urine YELLOW YELLOW   APPearance HAZY (A) CLEAR   Specific Gravity, Urine 1.010 1.005 - 1.030   pH 7.5 5.0 - 8.0   Glucose, UA NEGATIVE NEGATIVE mg/dL   Hgb urine dipstick NEGATIVE NEGATIVE   Bilirubin Urine NEGATIVE NEGATIVE   Ketones, ur NEGATIVE NEGATIVE mg/dL   Protein, ur NEGATIVE NEGATIVE mg/dL   Nitrite NEGATIVE NEGATIVE   Leukocytes, UA LARGE (A) NEGATIVE  Urine microscopic-add on  Status: Abnormal   Collection Time: 08/19/15 10:50 PM  Result Value Ref Range   Squamous Epithelial / LPF 0-5 (A) NONE SEEN   WBC, UA 0-5 0 - 5 WBC/hpf   RBC / HPF NONE SEEN 0 - 5 RBC/hpf   Bacteria, UA FEW (A) NONE SEEN   Urine-Other MUCOUS PRESENT   Pregnancy, urine POC     Status: Abnormal   Collection Time: 08/19/15 11:02 PM  Result Value Ref Range   Preg Test, Ur POSITIVE (A) NEGATIVE  Wet prep, genital     Status: Abnormal   Collection Time: 08/19/15 11:23 PM  Result Value Ref Range   Yeast Wet Prep HPF POC PRESENT (A) NONE SEEN   Trich, Wet Prep NONE SEEN NONE SEEN    Clue Cells Wet Prep HPF POC NONE SEEN NONE SEEN   WBC, Wet Prep HPF POC MANY (A) NONE SEEN   Sperm NONE SEEN   CBC with Differential/Platelet     Status: Abnormal   Collection Time: 08/19/15 11:35 PM  Result Value Ref Range   WBC 9.7 4.0 - 10.5 K/uL   RBC 4.13 3.87 - 5.11 MIL/uL   Hemoglobin 12.2 12.0 - 15.0 g/dL   HCT 16.1 (L) 09.6 - 04.5 %   MCV 83.1 78.0 - 100.0 fL   MCH 29.5 26.0 - 34.0 pg   MCHC 35.6 30.0 - 36.0 g/dL   RDW 40.9 81.1 - 91.4 %   Platelets 216 150 - 400 K/uL   Neutrophils Relative % 65 %   Neutro Abs 6.3 1.7 - 7.7 K/uL   Lymphocytes Relative 29 %   Lymphs Abs 2.8 0.7 - 4.0 K/uL   Monocytes Relative 4 %   Monocytes Absolute 0.4 0.1 - 1.0 K/uL   Eosinophils Relative 2 %   Eosinophils Absolute 0.2 0.0 - 0.7 K/uL   Basophils Relative 0 %   Basophils Absolute 0.0 0.0 - 0.1 K/uL  hCG, quantitative, pregnancy     Status: Abnormal   Collection Time: 08/19/15 11:35 PM  Result Value Ref Range   hCG, Beta Chain, Quant, S 82071 (H) <5 mIU/mL   US Ob Comp Less 14 Wks  08/20/2015  CLINICAL DATA:  30 year old female with abdominal cramping EXAM: OBSTETRIC <14 WK ULTRASOUND TECHNIQUE: Transabdominal ultrasound was performed for evaluation of the gestation as well as the maternal uterus and adnexal regions. COMPARISON:  None for this pregnancy FINDINGS: Intrauterine gestational sac: Single intrauterine gestational sac. Yolk sac:  Not seen Embryo:  Present Cardiac Activity: Detected Heart Rate: 164 bpm CRL:   68  mm   13 w 1 d                  [place] EDC: 02/24/2016 Subchorionic hemorrhage: Minimal subchorionic hemorrhage may be present. Maternal uterus/adnexae: The maternal ovaries appear unremarkable. No free fluid within the pelvis. IMPRESSION: Single live intrauterine pregnancy with an estimated gestational age of [redacted] weeks, 1 day. Electronically Signed   By: Elgie Collard M.D.   On: 08/20/2015 00:57    MAU Course  Procedures None   MDM +UPT UA, wet prep, GC/chlamydia, CBC,  quant hCG, HIV, RPR and [place] today to rule out ectopic pregnancy IV NS with 25 mg Benadryl, 10 mg Decadron and 10 mg Reglan  Reviewed [place] images independently. + cardiac activity noted.  Assessment and Plan  A: SIUP at [redacted]w[redacted]d Minimal Subchorionic hemorrhage noted Yeast vulvovaginitis Nausea and vomiting in pregnancy prior to [redacted] weeks gestation  Headache   P:  Discharge home Rx for Phenergan and Terazol given to patient  First trimester and bleeding precautions discussed Diet for N/V in pregnancy discussed and included on AVS Patient advised to follow-up with OB provider of choice Pregnancy confirmation letter given with list of area OB providers Patient may return to MAU as needed or if her condition were to change or worsen   Marny Lowenstein, PA-C  08/20/2015, 1:01 AM

## 2015-08-20 ENCOUNTER — Encounter (HOSPITAL_COMMUNITY): Payer: Self-pay | Admitting: Medical

## 2015-08-20 DIAGNOSIS — B373 Candidiasis of vulva and vagina: Secondary | ICD-10-CM

## 2015-08-20 DIAGNOSIS — O208 Other hemorrhage in early pregnancy: Secondary | ICD-10-CM

## 2015-08-20 DIAGNOSIS — O219 Vomiting of pregnancy, unspecified: Secondary | ICD-10-CM

## 2015-08-20 LAB — HIV ANTIBODY (ROUTINE TESTING W REFLEX): HIV SCREEN 4TH GENERATION: NONREACTIVE

## 2015-08-20 LAB — HCG, QUANTITATIVE, PREGNANCY: hCG, Beta Chain, Quant, S: 82071 m[IU]/mL — ABNORMAL HIGH (ref <5)

## 2015-08-20 LAB — RPR: RPR: NONREACTIVE

## 2015-08-20 MED ORDER — PROMETHAZINE HCL 12.5 MG PO TABS: 12.5000 mg | ORAL_TABLET | Freq: Four times a day (QID) | ORAL | Status: DC | PRN

## 2015-08-20 MED ORDER — TERCONAZOLE 0.8 % VA CREA: 1.0000 | TOPICAL_CREAM | Freq: Every day | VAGINAL | Status: DC

## 2015-08-20 NOTE — Discharge Instructions (Signed)
Nuseas matinales (Morning Sickness) Se denominan nuseas matinales a las ganas de vomitar (nuseas) durante el Psychiatrist. Comienza a sentir nuseas y devuelve (vomita). Siente nuseas por la [place], pero sigue sintindolas durante todo Medical laboratory scientific officer. Algunas mujeres experimentan nuseas intensas y no pueden detener los vmitos (hiperemesis [place]). CUIDADOS EN EL HOGAR  Solo tome los medicamentos que le haya indicado su mdico.  Tome las multivitaminas segn las indicaciones del mdico. Tomar multivitaminas antes de quedar embarazada puede detener o disminuir el malestar de las nuseas matinales.  Coma un trozo de [place] seca o galletas sin sal antes de levantarse de la cama por la maana.  Coma 5 o 6 comidas pequeas por da.  Consuma alimentos blandos y secos como arroz y patatas asadas.  No beba lquidos con las comidas. Tome lquidos [employer].  No coma alimentos fritos, grasos o muy condimentados.  Pdale a alguna persona que cocine para usted si [facility] de las [employer] da nuseas o ganas de Biochemist, clinical.  Si tiene ganas de vomitar despus de tomar las vitaminas prenatales, tmelas a la noche o con una colacin.  Consuma protenas cuando necesite una colacin (frutas secas, yogur, queso).  Coma gelatina sin azcar de postre.  Use un brazalete para los mareos (pulsera de acupresin).  Vaya a un especialista en colocar agujas finas en determinados puntos del cuerpo (acupuntura) para que usted se sienta mejor.  No fume.  Consiga un humidificador para Customer service manager de su casa libre de [place].  Trate de respirar aire fresco. SOLICITE AYUDA SI:  Necesita medicamentos para sentirse mejor.  Se siente mareada o sufre un desmayo.  Pierde peso. SOLICITE AYUDA DE INMEDIATO SI:   Tiene malestar estomacal y no puede dejar de vomitar.  Pierde el conocimiento (se desmaya). ASEGRESE DE QUE:  Comprende estas instrucciones.  Controlar su afeccin.  Recibir ayuda de  inmediato si no mejora o si empeora.   Esta informacin no tiene Theme park manager el consejo del mdico. Asegrese de hacerle al mdico cualquier pregunta que tenga.   Document Released: 02/26/2005 Document Revised: 03/19/2014 Elsevier Interactive Patient Education 2016 [employer]. Plan de alimentacin para la hiperemesis gravdica (Eating Plan for Hyperemesis Gravidarum) Los casos graves de hiperemesis gravdica pueden derivar en deshidratacin y desnutricin. El plan de alimentacin para la hiperemesis es una forma de disminuir los sntomas de nuseas y vmitos. A menudo se utiliza con medicamentos recetados para controlar los sntomas.  QU PUEDO HACER PARA ALIVIAR MIS SNTOMAS? Prstele atencin a su cuerpo. Todas las personas son diferentes y Radiographer, therapeutic. Descubra qu funciona mejor para usted. Algunas de las siguientes opciones pueden ser de ayuda:  Coma y beba lentamente.  Realice 5 o 6comidas pequeas por da en vez de tres grandes.  Consuma galletitas saladas antes de levantarse por la maana.  Los alimentos que contienen almidn normalmente se toleran bien, como cereales, tostadas, pan, papas, pasta, arroz y pretzels.  El jengibre tambin es til para Automotive engineer las nuseas. Aada  de cucharadita de jengibre molido al t caliente o beba t de jengibre.  Intente tomar jugo 100% de frutas o una bebida de electrolitos.  Contine tomando las vitaminas prenatales segn las indicaciones del mdico. Si tiene problemas para tomas sus vitaminas prenatales, hable con su mdico sobre las diferentes opciones.  Incluya al menos una porcin de protenas con las comidas y las colaciones (como carne de vaca o de ave, frijoles, frutos secos, Regulatory affairs officer). Intente comer una colacin rica en  protenas antes de irse a dormir (como queso y Gaffergalletas o medio sndwich de [place] de man o pavo). QU DEBO EVITAR PARA REDUCIR MIS SNTOMAS? Las siguientes opciones pueden  ser de ayuda para reducir sus sntomas:  Evite los alimentos con olores fuertes. Intente comer en reas bien ventiladas libres de olores.  Evite tomar agua u otras bebidas con las comidas. Intente no beber nada 30minutos antes y despus de las comidas.  Evite beber ms de una taza de lquidos a la vez.  Evite las comidas fritas o con gran contenido de [place] Gatewaygrasas, como salsas con [place] y crema.  Evite los alimentos muy condimentados.  Evite saltear comidas tanto como sea posible. Las nuseas pueden ser ms intensas con el estmago vaco. Si no puede Performance Food Grouptolerar los alimentos en ese momento, no se obligue. Intente chupar trozos de hielo u otros elementos congelados e ingiera las caloras ms tarde.  Evite recostarse Weyer[employer]user Companydurante las dos horas siguientes a comer.   Esta informacin no tiene Theme park managercomo fin reemplazar el consejo del mdico. Asegrese de hacerle al mdico cualquier pregunta que tenga.   Document Released: 06/14/2008 Document Revised: 03/03/2013 Elsevier Interactive Patient Education [employer]2016 Elsevier Inc.

## 2015-08-22 LAB — GC/CHLAMYDIA PROBE AMP (~~LOC~~) NOT AT ARMC
Chlamydia: NEGATIVE
Neisseria Gonorrhea: NEGATIVE

## 2015-12-15 LAB — OB RESULTS CONSOLE ANTIBODY SCREEN: Antibody Screen: NEGATIVE

## 2015-12-15 LAB — OB RESULTS CONSOLE HIV ANTIBODY (ROUTINE TESTING): HIV: NONREACTIVE

## 2015-12-15 LAB — OB RESULTS CONSOLE ABO/RH: RH Type: POSITIVE

## 2015-12-15 LAB — OB RESULTS CONSOLE RUBELLA ANTIBODY, IGM: Rubella: IMMUNE

## 2015-12-15 LAB — OB RESULTS CONSOLE RPR: RPR: NONREACTIVE

## 2015-12-15 LAB — OB RESULTS CONSOLE GC/CHLAMYDIA
Chlamydia: NEGATIVE
GC PROBE AMP, GENITAL: NEGATIVE

## 2015-12-15 LAB — OB RESULTS CONSOLE HEPATITIS B SURFACE ANTIGEN: HEP B S AG: NEGATIVE

## 2015-12-19 ENCOUNTER — Other Ambulatory Visit (HOSPITAL_COMMUNITY): Payer: Self-pay | Admitting: Nurse Practitioner

## 2015-12-19 DIAGNOSIS — Z3689 Encounter for other specified antenatal screening: Secondary | ICD-10-CM

## 2015-12-19 DIAGNOSIS — Z3A31 31 weeks gestation of pregnancy: Secondary | ICD-10-CM

## 2015-12-23 ENCOUNTER — Ambulatory Visit (HOSPITAL_COMMUNITY)
Admission: RE | Admit: 2015-12-23 | Discharge: 2015-12-23 | Disposition: A | Discharge status: Home / Self Care | Payer: Self-pay | Source: Ambulatory Visit | Attending: Nurse Practitioner | Admitting: Nurse Practitioner

## 2015-12-23 ENCOUNTER — Other Ambulatory Visit (HOSPITAL_COMMUNITY): Payer: Self-pay | Admitting: Nurse Practitioner

## 2015-12-23 DIAGNOSIS — Z3689 Encounter for other specified antenatal screening: Secondary | ICD-10-CM

## 2015-12-23 DIAGNOSIS — Z3A31 31 weeks gestation of pregnancy: Secondary | ICD-10-CM | POA: Insufficient documentation

## 2015-12-23 DIAGNOSIS — Z1389 Encounter for screening for other disorder: Secondary | ICD-10-CM

## 2015-12-23 DIAGNOSIS — Z363 Encounter for antenatal screening for malformations: Secondary | ICD-10-CM | POA: Insufficient documentation

## 2015-12-30 ENCOUNTER — Encounter: Payer: Self-pay | Admitting: Obstetrics and Gynecology

## 2015-12-30 DIAGNOSIS — Z8279 Family history of other congenital malformations, deformations and chromosomal abnormalities: Secondary | ICD-10-CM | POA: Insufficient documentation

## 2016-01-30 LAB — OB RESULTS CONSOLE GBS: GBS: NEGATIVE

## 2016-01-30 LAB — OB RESULTS CONSOLE GC/CHLAMYDIA
CHLAMYDIA, DNA PROBE: NEGATIVE
Gonorrhea: NEGATIVE

## 2016-02-27 ENCOUNTER — Encounter (HOSPITAL_COMMUNITY): Payer: Self-pay | Admitting: *Deleted

## 2016-02-27 ENCOUNTER — Telehealth (HOSPITAL_COMMUNITY): Payer: Self-pay | Admitting: *Deleted

## 2016-02-27 NOTE — Telephone Encounter (Signed)
Preadmission screen Interpreter number  

## 2016-02-28 NOTE — Telephone Encounter (Signed)
[phone] interpreter

## 2016-03-03 ENCOUNTER — Inpatient Hospital Stay (HOSPITAL_COMMUNITY)
Admission: RE | Admit: 2016-03-03 | Discharge: 2016-03-06 | DRG: 766 | Disposition: A | Discharge status: Home / Self Care | Payer: Medicaid Other | Source: Ambulatory Visit | Attending: Obstetrics & Gynecology | Admitting: Obstetrics & Gynecology

## 2016-03-03 ENCOUNTER — Encounter (HOSPITAL_COMMUNITY): Payer: Self-pay

## 2016-03-03 ENCOUNTER — Inpatient Hospital Stay (HOSPITAL_COMMUNITY): Payer: Medicaid Other | Admitting: Anesthesiology

## 2016-03-03 DIAGNOSIS — Z87410 Personal history of cervical dysplasia: Secondary | ICD-10-CM

## 2016-03-03 DIAGNOSIS — Z3A41 41 weeks gestation of pregnancy: Secondary | ICD-10-CM | POA: Diagnosis not present

## 2016-03-03 DIAGNOSIS — Z6833 Body mass index (BMI) 33.0-33.9, adult: Secondary | ICD-10-CM | POA: Diagnosis not present

## 2016-03-03 DIAGNOSIS — Z98891 History of uterine scar from previous surgery: Secondary | ICD-10-CM

## 2016-03-03 DIAGNOSIS — O99214 Obesity complicating childbirth: Secondary | ICD-10-CM | POA: Diagnosis present

## 2016-03-03 DIAGNOSIS — O480 Post-term pregnancy: Principal | ICD-10-CM | POA: Diagnosis present

## 2016-03-03 DIAGNOSIS — E669 Obesity, unspecified: Secondary | ICD-10-CM | POA: Diagnosis present

## 2016-03-03 LAB — CBC
HCT: 36.3 % (ref 36.0–46.0)
HEMOGLOBIN: 12.6 g/dL (ref 12.0–15.0)
MCH: 30.2 pg (ref 26.0–34.0)
MCHC: 34.7 g/dL (ref 30.0–36.0)
MCV: 87.1 fL (ref 78.0–100.0)
Platelets: 197 10*3/uL (ref 150–400)
RBC: 4.17 MIL/uL (ref 3.87–5.11)
RDW: 14.1 % (ref 11.5–15.5)
WBC: 9.5 10*3/uL (ref 4.0–10.5)

## 2016-03-03 LAB — TYPE AND SCREEN
ABO/RH(D): O POS
Antibody Screen: NEGATIVE

## 2016-03-03 MED ORDER — OXYTOCIN 40 UNITS IN LACTATED RINGERS INFUSION - SIMPLE MED
2.5000 [IU]/h | INTRAVENOUS | Status: DC
  Filled 2016-03-03: qty 1000

## 2016-03-03 MED ORDER — OXYTOCIN BOLUS FROM INFUSION: 500.0000 mL | Freq: Once | INTRAVENOUS | Status: DC

## 2016-03-03 MED ORDER — LACTATED RINGERS IV SOLN
500.0000 mL | INTRAVENOUS | Status: DC | PRN
  Administered 2016-03-04: 250 mL via INTRAVENOUS

## 2016-03-03 MED ORDER — ACETAMINOPHEN 325 MG PO TABS: 650.0000 mg | ORAL_TABLET | ORAL | Status: DC | PRN

## 2016-03-03 MED ORDER — FENTANYL 2.5 MCG/ML BUPIVACAINE 1/10 % EPIDURAL INFUSION (WH - ANES)
14.0000 mL/h | INTRAMUSCULAR | Status: DC | PRN
  Administered 2016-03-03: 11 mL/h via EPIDURAL
  Administered 2016-03-04: 14 mL/h via EPIDURAL
  Filled 2016-03-03 (×2): qty 100

## 2016-03-03 MED ORDER — OXYCODONE-ACETAMINOPHEN 5-325 MG PO TABS: 1.0000 | ORAL_TABLET | ORAL | Status: DC | PRN

## 2016-03-03 MED ORDER — PHENYLEPHRINE 40 MCG/ML (10ML) SYRINGE FOR IV PUSH (FOR BLOOD PRESSURE SUPPORT): 80.0000 ug | PREFILLED_SYRINGE | INTRAVENOUS | Status: DC | PRN

## 2016-03-03 MED ORDER — EPHEDRINE 5 MG/ML INJ
10.0000 mg | INTRAVENOUS | Status: DC | PRN
  Administered 2016-03-04: 10 mg via INTRAVENOUS

## 2016-03-03 MED ORDER — LACTATED RINGERS IV SOLN
500.0000 mL | Freq: Once | INTRAVENOUS | Status: AC
  Administered 2016-03-03: 22:00:00 via INTRAVENOUS

## 2016-03-03 MED ORDER — LACTATED RINGERS IV SOLN
INTRAVENOUS | Status: DC
  Administered 2016-03-03: 23:00:00 via INTRAVENOUS
  Administered 2016-03-03: 125 mL/h via INTRAVENOUS

## 2016-03-03 MED ORDER — TERBUTALINE SULFATE 1 MG/ML IJ SOLN: 0.2500 mg | Freq: Once | INTRAMUSCULAR | Status: DC | PRN

## 2016-03-03 MED ORDER — FLEET ENEMA 7-19 GM/118ML RE ENEM: 1.0000 | ENEMA | RECTAL | Status: DC | PRN

## 2016-03-03 MED ORDER — PHENYLEPHRINE 40 MCG/ML (10ML) SYRINGE FOR IV PUSH (FOR BLOOD PRESSURE SUPPORT)
80.0000 ug | PREFILLED_SYRINGE | INTRAVENOUS | Status: DC | PRN
  Filled 2016-03-03: qty 10

## 2016-03-03 MED ORDER — DIPHENHYDRAMINE HCL 50 MG/ML IJ SOLN: 12.5000 mg | INTRAMUSCULAR | Status: DC | PRN

## 2016-03-03 MED ORDER — SOD CITRATE-CITRIC ACID 500-334 MG/5ML PO SOLN
30.0000 mL | ORAL | Status: DC | PRN
  Administered 2016-03-04 (×2): 30 mL via ORAL
  Filled 2016-03-03 (×2): qty 15

## 2016-03-03 MED ORDER — FENTANYL CITRATE (PF) 100 MCG/2ML IJ SOLN
50.0000 ug | INTRAMUSCULAR | Status: DC | PRN
  Administered 2016-03-03 (×2): 100 ug via INTRAVENOUS
  Filled 2016-03-03 (×2): qty 2

## 2016-03-03 MED ORDER — MISOPROSTOL 25 MCG QUARTER TABLET
25.0000 ug | ORAL_TABLET | ORAL | Status: DC | PRN
  Administered 2016-03-03: 25 ug via VAGINAL
  Filled 2016-03-03: qty 0.25

## 2016-03-03 MED ORDER — LIDOCAINE HCL (PF) 1 % IJ SOLN
INTRAMUSCULAR | Status: DC | PRN
  Administered 2016-03-03: 4 mL via EPIDURAL
  Administered 2016-03-03: 3 mL via EPIDURAL

## 2016-03-03 MED ORDER — OXYCODONE-ACETAMINOPHEN 5-325 MG PO TABS
2.0000 | ORAL_TABLET | ORAL | Status: DC | PRN
Start: 2016-03-03 — End: 2016-03-04

## 2016-03-03 MED ORDER — ONDANSETRON HCL 4 MG/2ML IJ SOLN
4.0000 mg | Freq: Four times a day (QID) | INTRAMUSCULAR | Status: DC | PRN
  Administered 2016-03-03: 4 mg via INTRAVENOUS
  Filled 2016-03-03 (×2): qty 2

## 2016-03-03 MED ORDER — LIDOCAINE HCL (PF) 1 % IJ SOLN
30.0000 mL | INTRAMUSCULAR | Status: DC | PRN
  Filled 2016-03-03: qty 30

## 2016-03-03 MED ORDER — EPHEDRINE 5 MG/ML INJ: 10.0000 mg | INTRAVENOUS | Status: DC | PRN

## 2016-03-03 NOTE — Progress Notes (Signed)
Patient ID: Cassie Mcconnell, female   DOB: 03/27/1985, 30 y.o.   MRN: [id number] LABOR PROGRESS NOTE  Cassie Mcconnell is a 30 y.o. [id number] at 4236w1d  admitted for PDIOL  Subjective: Patient comfortable s/p epidural placement. Not feeling contractions. Complained of some occasional bloody mucus, reassured it's normal.   Objective: BP 110/65   Pulse 78   Temp 98.8 F (37.1 C)   Resp 18   Ht 5' (1.524 m)   Wt 172 lb (78 kg)   LMP 06/21/2015   BMI 33.59 kg/m  or  Vitals:   03/03/16 2101 03/03/16 2131 03/03/16 2200 03/03/16 2230  BP: 110/78 111/77 (!) 118/94 110/65  Pulse: (!) 149 85 75 78  Resp: 18 18 18 18   Temp:   98.8 F (37.1 C)   TempSrc:      Weight:      Height:         Dilation: 4.5 Effacement (%): 90 Cervical Position: Middle Station: -1 Presentation: Vertex Exam by:: Ileene Rubensiana Mensah  Labs: Lab Results  Component Value Date   WBC 9.5 03/03/2016   HGB 12.6 03/03/2016   HCT 36.3 03/03/2016   MCV 87.1 03/03/2016   PLT 197 03/03/2016    Patient Active Problem List   Diagnosis Date Noted  . Post-dates pregnancy 03/03/2016  . son with CHD "hole in heart" 12/30/2015  . CIN II (cervical intraepithelial neoplasia II) 02/09/2015  . Vaginal bleeding before [redacted] weeks gestation 09/20/2014  . Threatened abortion in first trimester 09/20/2014    Assessment / Plan: 30 y.o. [id number] at 4436w1d here for PDIOL  Labor: progressing well s/p foley bulb and cytotec Fetal Wellbeing:  Cat I. 135. Mod var. No decels Pain Control:  epidural Anticipated MOD:  NSVD  Clearance CootsAndrew Verdene Creson, MD 03/03/2016, 11:04 PM

## 2016-03-03 NOTE — Anesthesia Procedure Notes (Addendum)
Epidural Patient location during procedure: OB Start time: 03/03/2016 5:36 PM  Staffing Anesthesiologist: Mal AmabileFOSTER, Doroteo Nickolson Performed: anesthesiologist   Preanesthetic Checklist Completed: patient identified, site marked, surgical consent, pre-op evaluation, timeout performed, IV checked, risks and benefits discussed and monitors and equipment checked  Epidural Patient position: sitting Prep: site prepped and draped and DuraPrep Patient monitoring: continuous pulse ox and blood pressure Approach: midline Location: L3-L4 Injection technique: LOR air  Needle:  Needle type: Tuohy  Needle gauge: 17 G Needle length: 9 cm and 9 Needle insertion depth: 5 cm cm Catheter type: closed end flexible Catheter size: 19 Gauge Catheter at skin depth: 10 cm Test dose: negative and Other  Assessment Events: blood not aspirated, injection not painful, no injection resistance, negative IV test and no paresthesia  Additional Notes Patient identified. Risks and benefits discussed including failed block, incomplete  Pain control, post dural puncture headache, nerve damage, paralysis, blood pressure Changes, nausea, vomiting, reactions to medications-both toxic and allergic and post Partum back pain. All questions were answered. Patient expressed understanding and wished to proceed. Sterile technique was used throughout procedure. Epidural site was Dressed with sterile barrier dressing. No paresthesias, signs of intravascular injection Or signs of intrathecal spread were encountered.  Patient was more comfortable after the epidural was dosed. Please see RN's note for documentation of vital signs and FHR which are stable.

## 2016-03-03 NOTE — Anesthesia Preprocedure Evaluation (Addendum)
Anesthesia Evaluation  Patient identified by MRN, date of birth, ID band Patient awake    Reviewed: Allergy & Precautions, Patient's Chart, lab work & pertinent test results  Airway Mallampati: III  TM Distance: >3 FB Neck ROM: Full    Dental no notable dental hx. (+) Teeth Intact   Pulmonary neg pulmonary ROS,    Pulmonary exam normal breath sounds clear to auscultation       Cardiovascular negative cardio ROS Normal cardiovascular exam Rhythm:Regular Rate:Normal     Neuro/Psych negative neurological ROS  negative psych ROS   GI/Hepatic negative GI ROS, Neg liver ROS,   Endo/Other  Obesity  Renal/GU negative Renal ROS  negative genitourinary   Musculoskeletal negative musculoskeletal ROS (+)   Abdominal (+) + obese,   Peds  Hematology negative hematology ROS (+)   Anesthesia Other Findings   Reproductive/Obstetrics (+) Pregnancy                             Anesthesia Physical Anesthesia Plan  ASA: II and emergent  Anesthesia Plan: Epidural   Post-op Pain Management:    Induction:   Airway Management Planned: Natural Airway  Additional Equipment:   Intra-op Plan:   Post-operative Plan:   Informed Consent: I have reviewed the patients History and Physical, chart, labs and discussed the procedure including the risks, benefits and alternatives for the proposed anesthesia with the patient or authorized representative who has indicated his/her understanding and acceptance.     Plan Discussed with: Anesthesiologist, CRNA and Surgeon  Anesthesia Plan Comments: (C/Section for arrest of dilation and non reassuring FHR tracing. Will use epidural. )      Anesthesia Quick Evaluation

## 2016-03-03 NOTE — Progress Notes (Signed)
Patient ID: Baltazar Apolsa U Martinez, female   DOB: 06/26/1985, 30 y.o.   MRN: [id number]  S: Patient seen & examined for progress of labor. Patient is feeling uncomfortable with frequent contractions.    O:  Vitals:   03/03/16 0827 03/03/16 1030 03/03/16 1222 03/03/16 1540  BP: 133/83 120/68  136/80  Pulse: 97 66  67  Resp: 18 18 18 20   Temp:  98.8 F (37.1 C) 98.7 F (37.1 C) 98.2 F (36.8 C)  TempSrc:  Oral Oral Oral  Weight:      Height:        Dilation: 1 Effacement (%): 70 Station: -2 Presentation: Vertex Exam by:: Dr Omer JackMumaw  Foley bulb was placed with ease, insufflated to 60 cc.   FHT: 125 bpm, mod var, +accels, no decels TOCO: q2-553min   A/P: Foley bulb placed Too frequent and painful contractions for repeat cytotec, will wait and recheck in a few hours. Continue expectant management Anticipate SVD

## 2016-03-03 NOTE — H&P (Signed)
LABOR AND DELIVERY ADMISSION HISTORY AND PHYSICAL NOTE  Cassie Mcconnell is a 30 y.o. female [phone] with IUP at 5636w1d presenting by 13 wk US for post dates IOL.   She reports positive fetal movement. She denies leakage of fluid or vaginal bleeding.  Prenatal History/Complications:  H/o CIN I/II with LEEP done 03/2015; h/o child with CHD.  Past Medical History: Past Medical History:  Diagnosis Date  . HPV (human papilloma virus) infection     Past Surgical History: Past Surgical History:  Procedure Laterality Date  . COLPOSCOPY      Obstetrical History: OB History    Gravida Para Term Preterm AB Living   4 2 2   1 2    SAB TAB Ectopic Multiple Live Births   1       2      Social History: Social History   Social History  . Marital status: Married    Spouse name: N/A  . Number of children: N/A  . Years of education: N/A   Social History Main Topics  . Smoking status: Never Smoker  . Smokeless tobacco: Never Used  . Alcohol use No  . Drug use: No  . Sexual activity: Yes    Birth control/ protection: None   Other Topics Concern  . None   Social History Narrative  . None    Family History: Family History  Problem Relation Age of Onset  . Cancer Mother     cervical  . Birth defects Brother     hole in heart    Allergies: No Known Allergies  Prescriptions Prior to Admission  Medication Sig Dispense Refill Last Dose  . acetaminophen (TYLENOL) 500 MG tablet Take 500 mg by mouth every 6 (six) hours as needed.   08/19/2015 at 1600  . promethazine (PHENERGAN) 12.5 MG tablet Take 1 tablet (12.5 mg total) by mouth every 6 (six) hours as needed for nausea or vomiting. 30 tablet 0   . terconazole (TERAZOL 3) 0.8 % vaginal cream Place 1 applicator vaginally at bedtime. 20 g 0      Review of Systems   All systems reviewed and negative except as stated in HPI  Temperature 99.1 F (37.3 C), temperature source Oral, resp. rate 18, height 5' (1.524 m), weight 172 lb  (78 kg), last menstrual period 06/21/2015. General appearance: alert, cooperative, appears stated age and no distress Lungs: clear to auscultation bilaterally Heart: regular rate and rhythm Abdomen: soft, non-tender; bowel sounds normal Extremities: No calf swelling or tenderness Presentation: cephalic Fetal monitoring: 145 bpm, mod var, +accels, no decels Uterine activity: None     Prenatal labs: ABO, Rh: O/Positive/-- (10/05 0000) Antibody: Negative (10/05 0000) Rubella: !Error! IMMUNE RPR: Nonreactive (10/05 0000)  HBsAg: Negative (10/05 0000)  HIV: Non-reactive (10/05 0000)  GBS: Negative (11/20 0000)  1 hr Glucola: 97 Genetic screening:  Too late Anatomy US: Normal, female  Prenatal Transfer Tool  Maternal Diabetes: No Genetic Screening: Declined Maternal Ultrasounds/Referrals: Normal Fetal Ultrasounds or other Referrals:  None Maternal Substance Abuse:  No Significant Maternal Medications:  None Significant Maternal Lab Results: Lab values include: Group B Strep negative  No results found for this or any previous visit (from the past 24 hour(s)).  Patient Active Problem List   Diagnosis Date Noted  . Post-dates pregnancy 03/03/2016  . son with CHD "hole in heart" 12/30/2015  . CIN II (cervical intraepithelial neoplasia II) 02/09/2015  . Vaginal bleeding before [redacted] weeks gestation 09/20/2014  . Threatened  abortion in first trimester 09/20/2014    Assessment: Cassie Mcconnell is a 30 y.o. [id number] at 1372w1d here for PDIOL.  #Labor: Cytotec, then FB when able to be placed #Pain: Epidural on request, IV pain meds prn #FWB: Cat I #ID:  GBS Neg #MOF: Both #MOC:Nexplanon #Circ:  NO  Jen MowElizabeth Mumaw, DO OB Fellow [facility] 03/03/2016, 9:28 AM

## 2016-03-03 NOTE — Anesthesia Pain Management Evaluation Note (Signed)
  [facility] Pain Management Visit Note  Patient: Cassie Mcconnell, 30 y.o., female  "Hello [facility]. We have an anesthesia team available at all times to provide care throughout the hospital, including epidural management and anesthesia for C-section. I don't know your plan for the delivery whether it a natural birth, water birth, IV sedation, nitrous supplementation, doula or epidural, but we want to meet your pain goals."   1.Was your pain managed to your expectations on prior hospitalizations?   Yes   2.What is your expectation for pain management during this hospitalization?    Not sure.  3.How can we help you reach that goal? IV pain medication and possibly an epidural.  Record the patient's initial score and the patient's pain goal.   Pain: 0  Pain Goal: 5 [facility] wants you to be able to say your pain was always managed very well.  Amena Dockham 03/03/2016

## 2016-03-04 ENCOUNTER — Encounter (HOSPITAL_COMMUNITY)
Admission: RE | Disposition: A | Discharge status: Home / Self Care | Payer: Self-pay | Source: Ambulatory Visit | Attending: Obstetrics & Gynecology

## 2016-03-04 ENCOUNTER — Encounter (HOSPITAL_COMMUNITY): Payer: Self-pay

## 2016-03-04 DIAGNOSIS — Z87410 Personal history of cervical dysplasia: Secondary | ICD-10-CM

## 2016-03-04 DIAGNOSIS — O99214 Obesity complicating childbirth: Secondary | ICD-10-CM

## 2016-03-04 DIAGNOSIS — O480 Post-term pregnancy: Secondary | ICD-10-CM

## 2016-03-04 DIAGNOSIS — Z3A41 41 weeks gestation of pregnancy: Secondary | ICD-10-CM

## 2016-03-04 LAB — RPR: RPR: NONREACTIVE

## 2016-03-04 LAB — CBC
HEMATOCRIT: 32.3 % — AB (ref 36.0–46.0)
HEMOGLOBIN: 11.1 g/dL — AB (ref 12.0–15.0)
MCH: 30 pg (ref 26.0–34.0)
MCHC: 34.4 g/dL (ref 30.0–36.0)
MCV: 87.3 fL (ref 78.0–100.0)
Platelets: 199 10*3/uL (ref 150–400)
RBC: 3.7 MIL/uL — AB (ref 3.87–5.11)
RDW: 14.2 % (ref 11.5–15.5)
WBC: 30.3 10*3/uL — AB (ref 4.0–10.5)

## 2016-03-04 SURGERY — Surgical Case
Anesthesia: Epidural | Site: Abdomen

## 2016-03-04 MED ORDER — NALBUPHINE HCL 10 MG/ML IJ SOLN: 5.0000 mg | INTRAMUSCULAR | Status: DC | PRN

## 2016-03-04 MED ORDER — PHENYLEPHRINE HCL 10 MG/ML IJ SOLN
INTRAMUSCULAR | Status: DC | PRN
  Administered 2016-03-04: 40 ug via INTRAVENOUS

## 2016-03-04 MED ORDER — DIPHENHYDRAMINE HCL 25 MG PO CAPS
25.0000 mg | ORAL_CAPSULE | ORAL | Status: DC | PRN
  Administered 2016-03-04 (×3): 25 mg via ORAL
  Filled 2016-03-04 (×3): qty 1

## 2016-03-04 MED ORDER — LACTATED RINGERS IV SOLN
INTRAVENOUS | Status: DC | PRN
  Administered 2016-03-04: 40 [IU] via INTRAVENOUS

## 2016-03-04 MED ORDER — METHYLERGONOVINE MALEATE 0.2 MG/ML IJ SOLN
INTRAMUSCULAR | Status: AC
  Filled 2016-03-04: qty 1

## 2016-03-04 MED ORDER — SODIUM CHLORIDE 0.9% FLUSH: 3.0000 mL | INTRAVENOUS | Status: DC | PRN

## 2016-03-04 MED ORDER — CEFAZOLIN SODIUM-DEXTROSE 2-4 GM/100ML-% IV SOLN
INTRAVENOUS | Status: AC
  Filled 2016-03-04: qty 100

## 2016-03-04 MED ORDER — ONDANSETRON HCL 4 MG/2ML IJ SOLN
INTRAMUSCULAR | Status: DC | PRN
  Administered 2016-03-04: 4 mg via INTRAVENOUS

## 2016-03-04 MED ORDER — SENNOSIDES-DOCUSATE SODIUM 8.6-50 MG PO TABS
2.0000 | ORAL_TABLET | ORAL | Status: DC
Start: 2016-03-05 — End: 2016-03-06
  Administered 2016-03-04 – 2016-03-06 (×2): 2 via ORAL
  Filled 2016-03-04 (×2): qty 2

## 2016-03-04 MED ORDER — NALBUPHINE HCL 10 MG/ML IJ SOLN: 5.0000 mg | Freq: Once | INTRAMUSCULAR | Status: DC | PRN

## 2016-03-04 MED ORDER — DIPHENHYDRAMINE HCL 25 MG PO CAPS: 25.0000 mg | ORAL_CAPSULE | Freq: Four times a day (QID) | ORAL | Status: DC | PRN

## 2016-03-04 MED ORDER — FENTANYL CITRATE (PF) 100 MCG/2ML IJ SOLN: 25.0000 ug | INTRAMUSCULAR | Status: DC | PRN

## 2016-03-04 MED ORDER — COCONUT OIL OIL: 1.0000 "application " | TOPICAL_OIL | Status: DC | PRN

## 2016-03-04 MED ORDER — TERBUTALINE SULFATE 1 MG/ML IJ SOLN: 0.2500 mg | Freq: Once | INTRAMUSCULAR | Status: DC | PRN

## 2016-03-04 MED ORDER — MENTHOL 3 MG MT LOZG: 1.0000 | LOZENGE | OROMUCOSAL | Status: DC | PRN

## 2016-03-04 MED ORDER — OXYCODONE-ACETAMINOPHEN 5-325 MG PO TABS
2.0000 | ORAL_TABLET | ORAL | Status: DC | PRN
  Administered 2016-03-06: 2 via ORAL
  Filled 2016-03-04: qty 2

## 2016-03-04 MED ORDER — KETOROLAC TROMETHAMINE 30 MG/ML IJ SOLN
30.0000 mg | Freq: Four times a day (QID) | INTRAMUSCULAR | Status: AC | PRN
Start: 2016-03-04 — End: 2016-03-05

## 2016-03-04 MED ORDER — MEASLES, MUMPS & RUBELLA VAC ~~LOC~~ INJ
0.5000 mL | INJECTION | Freq: Once | SUBCUTANEOUS | Status: DC
  Filled 2016-03-04: qty 0.5

## 2016-03-04 MED ORDER — TETANUS-DIPHTH-ACELL PERTUSSIS 5-2.5-18.5 LF-MCG/0.5 IM SUSP: 0.5000 mL | Freq: Once | INTRAMUSCULAR | Status: DC

## 2016-03-04 MED ORDER — SIMETHICONE 80 MG PO CHEW
80.0000 mg | CHEWABLE_TABLET | ORAL | Status: DC | PRN
  Administered 2016-03-04 – 2016-03-05 (×3): 80 mg via ORAL
  Filled 2016-03-04 (×3): qty 1

## 2016-03-04 MED ORDER — PRENATAL MULTIVITAMIN CH
1.0000 | ORAL_TABLET | Freq: Every day | ORAL | Status: DC
  Administered 2016-03-04 – 2016-03-05 (×2): 1 via ORAL
  Filled 2016-03-04 (×2): qty 1

## 2016-03-04 MED ORDER — FERROUS SULFATE 325 (65 FE) MG PO TABS
325.0000 mg | ORAL_TABLET | Freq: Two times a day (BID) | ORAL | Status: DC
  Administered 2016-03-04 – 2016-03-06 (×4): 325 mg via ORAL
  Filled 2016-03-04 (×4): qty 1

## 2016-03-04 MED ORDER — BUPIVACAINE HCL (PF) 0.5 % IJ SOLN
INTRAMUSCULAR | Status: DC | PRN
  Administered 2016-03-04: 30 mL

## 2016-03-04 MED ORDER — OXYTOCIN 40 UNITS IN LACTATED RINGERS INFUSION - SIMPLE MED
1.0000 m[IU]/min | INTRAVENOUS | Status: DC
  Administered 2016-03-04: 2 m[IU]/min via INTRAVENOUS

## 2016-03-04 MED ORDER — IBUPROFEN 600 MG PO TABS
600.0000 mg | ORAL_TABLET | Freq: Four times a day (QID) | ORAL | Status: DC | PRN
  Administered 2016-03-04 (×2): 600 mg via ORAL

## 2016-03-04 MED ORDER — DIBUCAINE 1 % RE OINT: 1.0000 "application " | TOPICAL_OINTMENT | RECTAL | Status: DC | PRN

## 2016-03-04 MED ORDER — ACETAMINOPHEN 500 MG PO TABS
1000.0000 mg | ORAL_TABLET | Freq: Four times a day (QID) | ORAL | Status: DC | PRN
  Administered 2016-03-04: 1000 mg via ORAL
  Filled 2016-03-04: qty 2

## 2016-03-04 MED ORDER — METHYLERGONOVINE MALEATE 0.2 MG/ML IJ SOLN
INTRAMUSCULAR | Status: DC | PRN
  Administered 2016-03-04: 0.2 mg via INTRAMUSCULAR

## 2016-03-04 MED ORDER — OXYTOCIN 40 UNITS IN LACTATED RINGERS INFUSION - SIMPLE MED: 2.5000 [IU]/h | INTRAVENOUS | Status: AC

## 2016-03-04 MED ORDER — MIDAZOLAM HCL 2 MG/2ML IJ SOLN
INTRAMUSCULAR | Status: AC
  Filled 2016-03-04: qty 2

## 2016-03-04 MED ORDER — DEXTROSE 5 % IV SOLN
1.0000 ug/kg/h | INTRAVENOUS | Status: DC | PRN
  Filled 2016-03-04: qty 2

## 2016-03-04 MED ORDER — CEFAZOLIN SODIUM-DEXTROSE 2-3 GM-% IV SOLR
INTRAVENOUS | Status: DC | PRN
  Administered 2016-03-04: 2 g via INTRAVENOUS

## 2016-03-04 MED ORDER — DEXAMETHASONE SODIUM PHOSPHATE 4 MG/ML IJ SOLN
INTRAMUSCULAR | Status: DC | PRN
  Administered 2016-03-04: 4 mg via INTRAVENOUS

## 2016-03-04 MED ORDER — SIMETHICONE 80 MG PO CHEW
80.0000 mg | CHEWABLE_TABLET | ORAL | Status: DC
  Administered 2016-03-04 – 2016-03-06 (×3): 80 mg via ORAL
  Filled 2016-03-04 (×2): qty 1

## 2016-03-04 MED ORDER — MAGNESIUM HYDROXIDE 400 MG/5ML PO SUSP: 30.0000 mL | ORAL | Status: DC | PRN

## 2016-03-04 MED ORDER — LACTATED RINGERS IV SOLN
INTRAVENOUS | Status: DC | PRN
Start: 2016-03-04 — End: 2016-03-04
  Administered 2016-03-04: 04:00:00 via INTRAVENOUS

## 2016-03-04 MED ORDER — OXYCODONE-ACETAMINOPHEN 5-325 MG PO TABS
1.0000 | ORAL_TABLET | ORAL | Status: DC | PRN
  Administered 2016-03-05 (×3): 1 via ORAL
  Filled 2016-03-04 (×3): qty 1

## 2016-03-04 MED ORDER — ACETAMINOPHEN 325 MG PO TABS: 650.0000 mg | ORAL_TABLET | ORAL | Status: DC | PRN

## 2016-03-04 MED ORDER — MORPHINE SULFATE (PF) 0.5 MG/ML IJ SOLN
INTRAMUSCULAR | Status: DC | PRN
  Administered 2016-03-04: 4 mg via EPIDURAL
  Administered 2016-03-04: 1 mg via EPIDURAL

## 2016-03-04 MED ORDER — LACTATED RINGERS IV SOLN
INTRAVENOUS | Status: DC
  Administered 2016-03-04: via INTRAVENOUS

## 2016-03-04 MED ORDER — DEXAMETHASONE SODIUM PHOSPHATE 4 MG/ML IJ SOLN
INTRAMUSCULAR | Status: AC
  Filled 2016-03-04: qty 1

## 2016-03-04 MED ORDER — MEPERIDINE HCL 25 MG/ML IJ SOLN: 6.2500 mg | INTRAMUSCULAR | Status: DC | PRN

## 2016-03-04 MED ORDER — IBUPROFEN 600 MG PO TABS
600.0000 mg | ORAL_TABLET | Freq: Four times a day (QID) | ORAL | Status: DC
  Administered 2016-03-04 – 2016-03-06 (×6): 600 mg via ORAL
  Filled 2016-03-04 (×8): qty 1

## 2016-03-04 MED ORDER — MEPERIDINE HCL 25 MG/ML IJ SOLN
INTRAMUSCULAR | Status: DC | PRN
  Administered 2016-03-04 (×2): 12.5 mg via INTRAVENOUS

## 2016-03-04 MED ORDER — OXYTOCIN 10 UNIT/ML IJ SOLN
INTRAMUSCULAR | Status: AC
Start: 2016-03-04 — End: 2016-03-04
  Filled 2016-03-04: qty 1

## 2016-03-04 MED ORDER — MEPERIDINE HCL 25 MG/ML IJ SOLN
INTRAMUSCULAR | Status: AC
  Filled 2016-03-04: qty 1

## 2016-03-04 MED ORDER — WITCH HAZEL-GLYCERIN EX PADS: 1.0000 "application " | MEDICATED_PAD | CUTANEOUS | Status: DC | PRN

## 2016-03-04 MED ORDER — SCOPOLAMINE 1 MG/3DAYS TD PT72
MEDICATED_PATCH | TRANSDERMAL | Status: DC | PRN
  Administered 2016-03-04: 1 via TRANSDERMAL

## 2016-03-04 MED ORDER — ONDANSETRON HCL 4 MG/2ML IJ SOLN
INTRAMUSCULAR | Status: AC
  Filled 2016-03-04: qty 2

## 2016-03-04 MED ORDER — MORPHINE SULFATE (PF) 0.5 MG/ML IJ SOLN
INTRAMUSCULAR | Status: AC
  Filled 2016-03-04: qty 10

## 2016-03-04 MED ORDER — SODIUM BICARBONATE 8.4 % IV SOLN
INTRAVENOUS | Status: DC | PRN
  Administered 2016-03-04 (×3): 5 mL via EPIDURAL

## 2016-03-04 MED ORDER — BUPIVACAINE HCL (PF) 0.5 % IJ SOLN
INTRAMUSCULAR | Status: AC
  Filled 2016-03-04: qty 30

## 2016-03-04 MED ORDER — MIDAZOLAM HCL 2 MG/2ML IJ SOLN
INTRAMUSCULAR | Status: DC | PRN
  Administered 2016-03-04: 2 mg via INTRAVENOUS

## 2016-03-04 MED ORDER — SCOPOLAMINE 1 MG/3DAYS TD PT72
MEDICATED_PATCH | TRANSDERMAL | Status: AC
  Filled 2016-03-04: qty 1

## 2016-03-04 MED ORDER — KETOROLAC TROMETHAMINE 30 MG/ML IJ SOLN: 30.0000 mg | Freq: Four times a day (QID) | INTRAMUSCULAR | Status: AC | PRN

## 2016-03-04 MED ORDER — DIPHENHYDRAMINE HCL 50 MG/ML IJ SOLN: 12.5000 mg | INTRAMUSCULAR | Status: DC | PRN

## 2016-03-04 MED ORDER — ZOLPIDEM TARTRATE 5 MG PO TABS: 5.0000 mg | ORAL_TABLET | Freq: Every evening | ORAL | Status: DC | PRN

## 2016-03-04 MED ORDER — ONDANSETRON HCL 4 MG/2ML IJ SOLN: 4.0000 mg | Freq: Three times a day (TID) | INTRAMUSCULAR | Status: DC | PRN

## 2016-03-04 MED ORDER — ACETAMINOPHEN 500 MG PO TABS
1000.0000 mg | ORAL_TABLET | Freq: Four times a day (QID) | ORAL | Status: AC
  Administered 2016-03-04 (×3): 1000 mg via ORAL
  Filled 2016-03-04 (×3): qty 2

## 2016-03-04 MED ORDER — NALOXONE HCL 0.4 MG/ML IJ SOLN: 0.4000 mg | INTRAMUSCULAR | Status: DC | PRN

## 2016-03-04 SURGICAL SUPPLY — 37 items
BENZOIN TINCTURE PRP APPL 2/3 (GAUZE/BANDAGES/DRESSINGS) ×3 IMPLANT
CHLORAPREP W/TINT 26ML (MISCELLANEOUS) ×3 IMPLANT
CLAMP CORD UMBIL (MISCELLANEOUS) IMPLANT
CLOSURE WOUND 1/2 X4 (GAUZE/BANDAGES/DRESSINGS) ×1
CLOTH BEACON ORANGE TIMEOUT ST (SAFETY) ×3 IMPLANT
DRSG OPSITE POSTOP 4X10 (GAUZE/BANDAGES/DRESSINGS) ×3 IMPLANT
DRSG PAD ABDOMINAL 8X10 ST (GAUZE/BANDAGES/DRESSINGS) ×3 IMPLANT
ELECT REM PT RETURN 9FT ADLT (ELECTROSURGICAL) ×3
ELECTRODE REM PT RTRN 9FT ADLT (ELECTROSURGICAL) ×1 IMPLANT
EXTRACTOR VACUUM M CUP 4 TUBE (SUCTIONS) IMPLANT
EXTRACTOR VACUUM M CUP 4' TUBE (SUCTIONS)
GLOVE BIOGEL PI IND STRL 7.0 (GLOVE) ×3 IMPLANT
GLOVE BIOGEL PI INDICATOR 7.0 (GLOVE) ×6
GLOVE ECLIPSE 7.0 STRL STRAW (GLOVE) ×3 IMPLANT
GOWN STRL REUS W/TWL LRG LVL3 (GOWN DISPOSABLE) ×6 IMPLANT
KIT ABG SYR 3ML LUER SLIP (SYRINGE) IMPLANT
NEEDLE HYPO 22GX1.5 SAFETY (NEEDLE) ×3 IMPLANT
NEEDLE HYPO 25X5/8 SAFETYGLIDE (NEEDLE) ×3 IMPLANT
NS IRRIG 1000ML POUR BTL (IV SOLUTION) ×3 IMPLANT
PACK C SECTION WH (CUSTOM PROCEDURE TRAY) ×3 IMPLANT
PAD ABD 7.5X8 STRL (GAUZE/BANDAGES/DRESSINGS) ×3 IMPLANT
PAD OB MATERNITY 4.3X12.25 (PERSONAL CARE ITEMS) ×3 IMPLANT
PENCIL SMOKE EVAC W/HOLSTER (ELECTROSURGICAL) ×3 IMPLANT
RTRCTR C-SECT PINK 25CM LRG (MISCELLANEOUS) IMPLANT
SPONGE GAUZE 4X4 12PLY (GAUZE/BANDAGES/DRESSINGS) ×3 IMPLANT
SPONGE LAP 18X18 X RAY DECT (DISPOSABLE) ×6 IMPLANT
STRIP CLOSURE SKIN 1/2X4 (GAUZE/BANDAGES/DRESSINGS) ×2 IMPLANT
SUT PDS AB 0 CTX 36 PDP370T (SUTURE) IMPLANT
SUT PLAIN 2 0 (SUTURE) ×2
SUT PLAIN 2 0 XLH (SUTURE) ×3 IMPLANT
SUT PLAIN ABS 2-0 CT1 27XMFL (SUTURE) ×1 IMPLANT
SUT VIC AB 0 CTX 36 (SUTURE) ×6
SUT VIC AB 0 CTX36XBRD ANBCTRL (SUTURE) ×3 IMPLANT
SUT VIC AB 4-0 KS 27 (SUTURE) ×3 IMPLANT
SYR CONTROL 10ML LL (SYRINGE) ×3 IMPLANT
TOWEL OR 17X24 6PK STRL BLUE (TOWEL DISPOSABLE) ×3 IMPLANT
TRAY FOLEY CATH SILVER 14FR (SET/KITS/TRAYS/PACK) ×3 IMPLANT

## 2016-03-04 NOTE — Lactation Note (Signed)
This note was copied from a baby's chart. Lactation Consultation Note Mom has 2 other children, her son she BF for 3 months, she didn't BF her daughter. Mom has flat nipples. Rt. Nipples looks slightly inverted? Hard to tell d/t cracked and positional stripe. Lt. Nipple flat w/positonal stripe. Very wide space between breast.has hX of low milk supply and had to supplement w/her son. Mom hopes not to have to supplement, but wants baby to have food. Comfort gels given for cracked nipples. Lt. Nipple pulls out slightly w/stimulation.  Mom speaks Spanish, interpreter present.  Mom encouraged to feed baby 8-12 times/24 hours and with feeding cues. Mom encouraged to waken baby for feeds. Mom shown how to use DEBP & how to disassemble, clean, & reassemble parts. Mom knows to pump q3h for 15-20 min. Educated about newborn behavior, and feeding habits.  Mom taught how to apply & clean nipple shield. Fitted #16. Shells given to assist in everting nipples. Hand pump given to pre-pump to define boards of NS.  Latched baby on in football hold. Baby weigh 9.7lb. Heavy. Props used to support hand of mom.  Baby fussy at breast, finally latched. Wouldn't suckle on breast. Held onto breast for 15 mins. During that time hand expressed Lt. Breast, glisten of colostrum. Baby screams at breast if removed. Inserted 1 ml of Alimentum. Baby suckled then wouldn't suckle anymore. A little Alementum left in NS.     Patent Name:\  Cassie Mcconnell Today's Date: 03/04/2016 Reason for consult: Initial assessment   Maternal Data Has patient been taught Hand Expression?: Yes Does the patient have breastfeeding experience prior to this delivery?: Yes  Feeding Feeding Type: Formula Length of feed: 15 min  LATCH Score/Interventions Latch: Too sleepy or reluctant, no latch achieved, no sucking elicited. Intervention(s): Skin to skin;Teach feeding cues;Waking techniques Intervention(s): Adjust position;Assist with  latch;Breast massage;Breast compression  Audible Swallowing: None Intervention(s): Skin to skin;Hand expression Intervention(s): Alternate breast massage  Type of Nipple: Flat  Comfort (Breast/Nipple): Filling, red/small blisters or bruises, mild/mod discomfort  Problem noted: Cracked, bleeding, blisters, bruises;Mild/Moderate discomfort Interventions  (Cracked/bleeding/bruising/blister): Hand pump;Double electric pump Interventions (Mild/moderate discomfort): Comfort gels;Breast shields;Post-pump;Hand expression;Hand massage;Pre-pump if needed  Hold (Positioning): Full assist, staff holds infant at breast Intervention(s): Breastfeeding basics reviewed;Support Pillows;Position options;Skin to skin  LATCH Score: 2  Lactation Tools Discussed/Used Tools: Shells;Nipple Shields;Pump;Flanges;Comfort gels Nipple shield size: 16 Flange Size: Other (comment) (21) Shell Type: Inverted Breast pump type: Double-Electric Breast Pump WIC Program: Yes Pump Review: Setup, frequency, and cleaning;Milk Storage Initiated by:: Peri JeffersonL. Lendell Gallick RN IBCLC Date initiated:: 03/04/16   Consult Status Consult Status: Follow-up Date: 03/04/16 Follow-up type: In-patient    Charyl DancerCARVER, Cassie Fromer G 03/04/2016, 2:36 PM

## 2016-03-04 NOTE — Op Note (Signed)
Cassie Mcconnell PROCEDURE DATE: 03/04/2016  PREOPERATIVE DIAGNOSES: Intrauterine pregnancy at 6312w2d weeks gestation; failure to progress: arrest of dilation and non-reassuring fetal status  POSTOPERATIVE DIAGNOSES: The same  PROCEDURE: Primary Low Transverse Cesarean Section  SURGEON:  Dr. Jaynie CollinsUgonna Brenn Deziel  ANESTHESIOLOGIST: Dr. Mal AmabileMichael Foster  INDICATIONS: Cassie Apolsa U Mcconnell is a 30 y.o. [phone] at 5212w2d here for cesarean section secondary to the indications listed under preoperative diagnoses; please see preoperative note for further details.  The risks of cesarean section were discussed with the patient including but were not limited to: bleeding which may require transfusion or reoperation; infection which may require antibiotics; injury to bowel, bladder, ureters or other surrounding organs; injury to the fetus; need for additional procedures including hysterectomy in the event of a life-threatening hemorrhage; placental abnormalities wth subsequent pregnancies, incisional problems, thromboembolic phenomenon and other postoperative/anesthesia complications.   The patient concurred with the proposed plan, giving informed written consent for the procedure.    FINDINGS:  Viable female infant in cephalic presentation.  Apgars 8 and 9.  Clear amniotic fluid.  Intact placenta, three vessel cord.  Normal uterus, fallopian tubes and ovaries bilaterally.  case.   ANESTHESIA: Epidural INTRAVENOUS FLUIDS: 2000 ml ESTIMATED BLOOD LOSS: 1500 ml URINE OUTPUT:  150 ml SPECIMENS: Placenta sent to pathology COMPLICATIONS: Increased blood loss due to right uterine vessel injury.  After hysterotomy closure, patient had an episode of intense agitation/generalized body movements with HR in 130s, BP 113/38.  She became suddenly unresponsive and the heart rate monitor read asystole for about about 3 seconds, then normal vitals returned.  Patient was then responsive; she did receive versed during that episode given  possible seizure.  Entire episode lasted about 15-20 seconds, patient stable afterwards.  This was concerning for possible micro emboli; amniotic fluid vs air?  Patient stable throughout rest of the procedure.  PROCEDURE IN DETAIL:  The patient preoperatively received intravenous antibiotics and had sequential compression devices applied to her lower extremities.  She was then taken to the operating room where the epidural anesthesia was dosed up to surgical level and was found to be adequate. She was then placed in a dorsal supine position with a leftward tilt, and prepped and draped in a sterile manner.  A foley catheter was placed into her bladder and attached to constant gravity.  After an adequate timeout was performed, a Pfannenstiel skin incision was made with scalpel and carried through to the underlying layer of fascia. The fascia was incised in the midline, and this incision was extended bilaterally using the Mayo scissors.  Kocher clamps were applied to the superior aspect of the fascial incision and the underlying rectus muscles were dissected off bluntly.  A similar process was carried out on the inferior aspect of the fascial incision. The rectus muscles were separated in the midline bluntly and the peritoneum was entered bluntly. Attention was turned to the lower uterine segment where a low transverse hysterotomy was made with a scalpel and extended bilaterally bluntly.  The infant was successfully delivered, the cord was clamped and cutafter one minute, and the infant was handed over to the awaiting neonatology team. Uterine massage was then administered, and the placenta delivered intact with a three-vessel cord. The uterus was then cleared of clots and debris.  The hysterotomy was closed with 0 Vicryl in a running locked fashion, with care given to place a figure of eight suture around the injured right uterine vessel.Marland Kitchen. An imbricating layer was also placed with 0 Vicryl.  The episode then occurred  as described above; resumed rest of closure was patient was stabilized. The pelvis was cleared of all clot and debris. Hemostasis was confirmed on all surfaces.  The peritoneum was closed with a 0 Vicryl running stitch and the rectus muscles were reapproximated using 0 Vicryl interrupted stitches. The fascia was then closed using 0 Vicryl in a running fashion.  The subcutaneous layer was irrigated, then reapproximated with 2-0 plain gut interrupted stitches, and 30 ml of 0.5% Marcaine was injected subcutaneously around the incision.  The skin was closed with a 4-0 Vicryl subcuticular stitch. The patient tolerated the procedure well. Sponge, lap, instrument and needle counts were correct x 3.  She was taken to the recovery room in stable condition.    Jaynie CollinsUGONNA  Veronica Guerrant, MD, FACOG Attending Obstetrician & Gynecologist Faculty Practice, [facility] [place]

## 2016-03-04 NOTE — Clinical Social Work Maternal (Signed)
  CLINICAL SOCIAL WORK MATERNAL/CHILD NOTE  Patient Details  Name: Cassie Mcconnell MRN: [id number] Date of Birth: 01/23/1986  Date:  08-Aug-2015  Clinical Social Worker Initiating Note:  Laurey Arrow Date/ Time Initiated:  2015-05-21/1000     Child's Name:  Cassie Mcconnell   Legal Guardian:  Mother   Need for Interpreter:  Spanish   Date of Referral:  05-Nov-2015     Reason for Referral:  Late or No Prenatal Care    Referral Source:  Central Nursery   Address:  [address] [place] [address]  Phone number:  [phone]   Household Members:  Self, Minor Children, Spouse   Natural Supports (not living in the home):      Professional Supports: None   Employment: Unemployed   Type of Work:     Education:  Other (comment) (4th grade)   Financial Resources:   (CSW provided [name] with information to apply for Medicaid and WIC for infant. )   Other Resources:      Cultural/Religious Considerations Which May Impact Care:  None Reported  Strengths:  Ability to meet basic needs , Pediatrician chosen , Home prepared for child    Risk Factors/Current Problems:  None   Cognitive State:  Able to Concentrate , Alert , Goal Oriented , Linear Thinking    Mood/Affect:  Happy , Relaxed , Interested , Comfortable    CSW Assessment:  CSW met with [name] to complete an assessment for a consult for Late [facility].  [name] was inviting, calm, and interested with speaking with CSW. CSW utilized [employer] to assist with language barrier. When CSW arrived [name] was in bed breastfeeding infant.  [name] introduced her room guest as her husband/FOB Guerry Bruin) and gave CSW permission to speak with [name] while FOB was visiting; FOB was not engage during    [place] inquired about [name]'s Late PNC, and [name] disclosed that [name] did not have health insurance and was unable to  secure an earlier appointment at [facility] for [facility]. CSW informed [name] of the hospital's drug  screen policy, and informed [name] of the 2 screenings for the infant.  [name] was understanding and denied utilizing any substance during pregnancy.  CSW explained that CSW will monito the infant's UDS and Cord, and will make a report to CPS if needed.  [name] stated she was not concerned, and did not have any questions about the hospital's policy. CSW educated [name] about PPD; [name] denied PPD with [name]'s older 2 children. CSW reviewed safe sleep, and SIDS. [name] was knowledgeable and asked appropriate questions.  [name] communicated that she has a crib for the baby, and feels prepared for the infant.  CSW provided [name] information to have car seat safely installed. [name] denied any barriers to follow-up appointments for [name] and infant. [name] did not have any further questions, concerns, or needs at this time.  CSW Plan/Description:  Information/Referral to [employer] , No Further Intervention Required/No Barriers to Discharge, Patient/Family Education  (CSW will monitor infant's cord and UDS and will make a report to [facility] is warranted. )   Laurey Arrow, MSW, LCSW Clinical Social Work [phone]   Dimple Nanas, LCSW 2015-06-12, 10:06 AM

## 2016-03-04 NOTE — Transfer of Care (Signed)
Immediate Anesthesia Transfer of Care Note  Patient: Cassie Mcconnell  Procedure(s) Performed: Procedure(s): CESAREAN SECTION (N/A)  Patient Location: PACU  Anesthesia Type:Epidural  Level of Consciousness: awake, alert  and oriented  Airway & Oxygen Therapy: Patient Spontanous Breathing  Post-op Assessment: Report given to RN and Post -op Vital signs reviewed and stable  Post vital signs: Reviewed and stable  Last Vitals:  Vitals:   03/04/16 0410 03/04/16 0415  BP: 135/79 129/78  Pulse: 92 (!) 103  Resp:    Temp: 37.2 C     Last Pain:  Vitals:   03/04/16 0410  TempSrc: Axillary  PainSc:          Complications: No apparent anesthesia complications

## 2016-03-04 NOTE — Progress Notes (Signed)
Labor Progress Note Cassie Mcconnell is a 30 y.o. [phone] at 6317w2d presented for IOL for post dates  S: Comfortable with epidural, no complaints. Patient is Spanish-speaking only, video Spanish interpreter utilized for this encounter.  O: BP (!) 116/58   Pulse 78   Temp 98.9 F (37.2 C) (Axillary)   Resp 16   Ht 5' (1.524 m)   Wt 172 lb (78 kg)   LMP 06/21/2015   BMI 33.59 kg/m  EFM: baseline 155 bpm/min variability/no accels/ late and variable decels  Toco: 3-4 SVE: Dilation: 5 Effacement (%): 70 Cervical Position: Anterior Station: -1 Presentation: Vertex Exam by:: Dr. Lynetta MareAnyawu +Cervical edema + Fetal caput and molding, asynclitic presentation. Adequate contractions for over one hour with no cervical change.  A/P: 30 y.o. [id number] 717w2d  1. Labor: protracted  2. FWB: Persistent Cat II 3. Pain: epidural Cesarean section recommended for failure of cervical dilation and nonreassuring fetal heart rate tracing/persistent Category II FHR tracing.  The risks of cesarean section discussed with the patient included but were not limited to: bleeding which may require transfusion or reoperation; infection which may require antibiotics; injury to bowel, bladder, ureters or other surrounding organs; injury to the fetus; need for additional procedures including hysterectomy in the event of a life-threatening hemorrhage; placental abnormalities wth subsequent pregnancies, incisional problems, thromboembolic phenomenon and other postoperative/anesthesia complications. The patient concurred with the proposed plan, giving informed written consent for the procedure.  Anesthesia and OR aware. Preoperative prophylactic antibiotics and SCDs ordered on call to the OR.  To OR when ready.   Jaynie CollinsUgonna Yafet Cline, MD 4:14 AM

## 2016-03-04 NOTE — Progress Notes (Signed)
Interpreter - Rockne MenghiniMarta Mcconnell arrived at 7 am and stayed and translated until 8am.

## 2016-03-04 NOTE — Progress Notes (Signed)
Labor Progress Note Cassie Mcconnell is a 30 y.o. [phone] at 2236w2d presented for IOL for post dates Pitocin turned off d/t late decels  S:  Comfortable with epidural, no complaints.  O:  BP (!) 116/58   Pulse 78   Temp 99.6 F (37.6 C) (Oral)   Resp 18   Ht 5' (1.524 m)   Wt 78 kg (172 lb)   LMP 06/21/2015   BMI 33.59 kg/m  EFM: baseline 160 bpm/ min variability/ no accels/ late, early decels  Toco: 3-4 SVE: Dilation: 6 Effacement (%): 70 Cervical Position: Anterior Station: -1 Presentation: Vertex Exam by:: Dr. Denyse AmassBhambri Pitocin: off  A/P: 30 y.o. [id number] 6336w2d  1. Labor: protracted 2. FWB: Cat II 3. Pain: epidural Low grade fever. Edematous cervix, ?asynclytic. Allow time for fetal recovery. Peanut ball. MOD unclear. Dr. Macon LargeAnyanwu updated.  Cassie Mcconnell, CNM 3:25 AM

## 2016-03-04 NOTE — Anesthesia Postprocedure Evaluation (Signed)
Anesthesia Post Note  Patient: Cassie Mcconnell  Procedure(s) Performed: Procedure(s) (LRB): CESAREAN SECTION (N/A)  Patient location during evaluation: Mother Baby Anesthesia Type: Epidural Level of consciousness: awake and alert Pain management: satisfactory to patient Vital Signs Assessment: post-procedure vital signs reviewed and stable Respiratory status: respiratory function stable Cardiovascular status: stable Postop Assessment: no headache, no backache, epidural receding, patient able to bend at knees, no signs of nausea or vomiting and adequate PO intake Anesthetic complications: no        Last Vitals:  Vitals:   03/04/16 0748 03/04/16 0859  BP: 119/65 123/67  Pulse: 79 77  Resp: 18 18  Temp: 36.8 C 36.8 C    Last Pain:  Vitals:   03/04/16 0859  TempSrc:   PainSc: 0-No pain   Pain Goal:                 Dreshawn Hendershott

## 2016-03-05 ENCOUNTER — Encounter (HOSPITAL_COMMUNITY): Payer: Self-pay | Admitting: Obstetrics & Gynecology

## 2016-03-05 LAB — CBC
HCT: 23.2 % — ABNORMAL LOW (ref 36.0–46.0)
HEMOGLOBIN: 8 g/dL — AB (ref 12.0–15.0)
MCH: 30.9 pg (ref 26.0–34.0)
MCHC: 34.9 g/dL (ref 30.0–36.0)
MCV: 88.5 fL (ref 78.0–100.0)
Platelets: 187 10*3/uL (ref 150–400)
RBC: 2.62 MIL/uL — AB (ref 3.87–5.11)
RDW: 14.6 % (ref 11.5–15.5)
WBC: 14.9 10*3/uL — ABNORMAL HIGH (ref 4.0–10.5)

## 2016-03-05 MED ORDER — OXYCODONE-ACETAMINOPHEN 5-325 MG PO TABS: 1.0000 | ORAL_TABLET | ORAL | 0 refills | Status: DC | PRN

## 2016-03-05 NOTE — Lactation Note (Signed)
This note was copied from a baby's chart. Lactation Consultation Note  Patient Name: Cassie Mcconnell ZOXWR'UToday's Date: 03/05/2016 Reason for consult: Follow-up assessment Interpreter used. Baby at 35 hr of life. Mom is bf and formula feeding. She is concerned that her milk has not "come in yet". She has DEBP set up in room but has not used it because the last time she did not "get any milk". Discussed baby behavior, feeding frequency, baby belly size, voids, wt loss, breast changes, and nipple care. Encouraged mom to post pump after every bf and bf baby every time he cues before offering formula. She reports bilateral nipple pain that has stayed the same from yesterday, no better or no worse. She is using her expressed milk and comfort gels. "Sometimes" she use the NS. Mom is aware of lactation services and support group. She will call at next bf for latch help.    Maternal Data    Feeding Feeding Type: Bottle Fed - Formula Length of feed: 30 min  LATCH Score/Interventions                      Lactation Tools Discussed/Used Tools: Nipple Shields Nipple shield size: 16   Consult Status Consult Status: Follow-up Date: 03/06/16 Follow-up type: In-patient    Rulon Eisenmengerlizabeth E Mehar Kirkwood 03/05/2016, 4:39 PM

## 2016-03-05 NOTE — Plan of Care (Signed)
Problem: Activity: Goal: Will verbalize the importance of balancing activity with adequate rest periods Outcome: Completed/Met Date Met: 03/05/16 Encouraged increased ambulation to assist with discomfort.

## 2016-03-05 NOTE — Progress Notes (Signed)
Post Partum Day 1 Subjective: no complaints, up ad lib, voiding, tolerating PO and + flatus. Breastfeeding.  Objective: Blood pressure 126/66, pulse 69, temperature 98.3 F (36.8 C), temperature source Oral, resp. rate 18, height 5' (1.524 m), weight 172 lb (78 kg), last menstrual period 06/21/2015, SpO2 98 %, unknown if currently breastfeeding.  Physical Exam:  General: alert, cooperative and no distress Lochia: appropriate Uterine Fundus: firm Incision: healing well, incision c/d/i DVT Evaluation: No evidence of DVT seen on physical exam.   Recent Labs  03/04/16 1003 03/05/16 0539  HGB 11.1* 8.0*  HCT 32.3* 23.2*    Assessment/Plan: Plan for discharge tomorrow   LOS: 2 days   Clearance Cootsndrew Demario Faniel 03/05/2016, 8:37 AM

## 2016-03-05 NOTE — Progress Notes (Signed)
Ortho VS obtained with interpreter on the phone. Mom denies dizziness while sitting, but upon standing complained of a little dizziness. Returned to a sitting position and given a few minutes. Dizziness resolved, patient stood and denied dizziness. Vitals obtained and patient ambulated to bathroom. VS stable (see flowsheets). Foley removed. Output of 875ml. Patient educated on the voiding trial process and patient verbalized understanding. Will continue to monitor. Royston CowperIsley, Wil Slape E, RN

## 2016-03-05 NOTE — Progress Notes (Signed)
UR chart review completed.  

## 2016-03-06 MED ORDER — OXYCODONE-ACETAMINOPHEN 5-325 MG PO TABS: 1.0000 | ORAL_TABLET | ORAL | 0 refills | Status: DC | PRN

## 2016-03-06 NOTE — Lactation Note (Signed)
This note was copied from a baby's chart. Lactation Consultation Note  [place] interpreter video used for Spanish. Baby latched in laid back side lying on R side.  Intermittent sucks and swallows observed. L nipple is sore. Mother is using comfort gels and using ebm. After feeding on one side, demonstrated how to wake baby to breastfeed on 2nd breast. Baby latched on L side easily.  Demonstrated how to flange bottom lip. Mom encouraged to feed baby 8-12 times/24 hours and with feeding cues.  Encouraged mother to post pump with DEBP and hand express and give baby back volume on spoon until he stools. Reviewed engorgement care and monitoring voids/stools.   Patient Name: Cassie Mcconnell GNFAO'ZToday's Date: 03/06/2016 Reason for consult: Follow-up assessment   Maternal Data    Feeding Feeding Type: Breast Fed Length of feed: 15 min  LATCH Score/Interventions Latch: Grasps breast easily, tongue down, lips flanged, rhythmical sucking. Intervention(s): Adjust position;Assist with latch  Audible Swallowing: A few with stimulation Intervention(s): Skin to skin  Type of Nipple: Everted at rest and after stimulation  Comfort (Breast/Nipple): Filling, red/small blisters or bruises, mild/mod discomfort  Problem noted: Cracked, bleeding, blisters, bruises Interventions  (Cracked/bleeding/bruising/blister): Expressed breast milk to nipple Interventions (Mild/moderate discomfort): Comfort gels  Hold (Positioning): No assistance needed to correctly position infant at breast.  LATCH Score: 8  Lactation Tools Discussed/Used     Consult Status Consult Status: Complete    Hardie PulleyBerkelhammer, Ruth Boschen 03/06/2016, 10:18 AM

## 2016-03-06 NOTE — Discharge Instructions (Signed)
Almacenamiento de AutoNationla leche materna (Storing Breast Milk) La leche materna es un lquido vital que contiene clulas que combaten infecciones (anticuerpos). La Bed Bath & Beyondleche materna que se sac con anterioridad (extrajo) debe almacenarse de un cierto modo para que mantenga su eficacia en lo que concierne a la proteccin del beb contra las infecciones. Las siguientes pautas son para Airline pilotalmacenar la leche materna para un beb sano nacido a trmino.  CUNTO TIEMPO SE PUEDE ALMACENAR LA LECHE MATERNA?  La leche puede almacenarse hasta 4horas a temperatura ambiente o a una temperatura de 78F a 56F (15,6C a 19,4C). Sin embargo, se la puede guardar durante 6 a 8horas si las piezas del sacaleches y los recipientes estn bien higienizados.  La leche puede almacenarse durante 3das en un refrigerador a menos de 348F (3,9C). Sin embargo, se la puede guardar durante 8das si las piezas del sacaleches y los recipientes estn bien higienizados.  La leche puede almacenarse durante 2semanas en un congelador que se encuentra dentro de Community education officerun refrigerador.  La leche puede almacenarse durante 3 o 4meses en un congelador con una puerta aparte.  La leche puede almacenarse durante 6 a 12meses en una cmara de congelacin a -48F (-20C). Posey BoyerUna cmara de congelacin es un arcn congelador o un congelador independiente que no se abre con frecuencia y se mantiene a una temperatura inferior. CMO DEBO ALMACENAR LA LECHE MATERNA?  La leche materna puede almacenarse de la siguiente forma:  En un recipiente de vidrio.  En un envase de plstico rgido.  En Lucile Shuttersuna bolsa de plstico diseada especialmente para Counselling psychologistalmacenar leche. Muchas mujeres prefieren esta opcin porque ocupa menos espacio y se puede Press photographerconectar directamente al sacaleches.  Almacene la leche en porciones de 2 a 4onzas (60 a 120ml) para que sea ms fcil descongelarla. Adems, esto es til para no desechar la Temple-Inlandleche que el beb no toma.  Deje un espacio de  aproximadamente una pulgada en la parte superior de la bolsa o el bibern para permitir que la Rockledgeleche se expanda a medida que se congela.  Etiquete cada recipiente con la fecha y la hora que se sac la leche para usarla en el orden en que se extrajo.  Si va a congelar la Wescosvilleleche, gurdela en la parte posterior del congelador para evitar que los cambios de temperatura que se producen al abrir la puerta de la unidad la afecten. CMO DESCONGELAR LA LECHE MATERNA CONGELADA  Se puede descongelar la leche materna de la siguiente forma:  En un refrigerador.  Debajo del chorro de agua tibia del grifo.  En un recipiente con agua tibia que haya sido calentada en la estufa.  No caliente la YRC Worldwideleche directamente en la estufa o en el microondas ya que se destruirn algunas de sus propiedades para combatir infecciones.  Se puede almacenar la Colgate Palmoliveleche materna descongelada en un refrigerador durante 24horas como mximo, pero no se debe volver a Camera operatorcongelar.  Si desea agregar WPS Resourcesleche recientemente extrada a la Australialeche congelada, debe agregar menos cantidad de Flor[place] Cityleche de la que ya est congelada. Enfre la WPS Resourcesleche recientemente extrada en el refrigerador durante 30minutos antes de agregrsela a la leche que est en Personal assistantel congelador. Esta informacin no tiene Theme park managercomo fin reemplazar el consejo del mdico. Asegrese de hacerle al mdico cualquier pregunta que tenga. Document Released: 02/14/2009 Document Revised: 03/03/2013 Elsevier Interactive Patient Education  2017 Arvin[employer]vier [employer]. Cuidados en el postparto luego de un parto por Copycesrea (Postpartum Care After Cesarean Delivery) El perodo de tiempo que sigue inmediatamente al Claytonparto se  conoce como puerperio. QU TIPO DE ATENCIN MDICA RECIBIR?  Podra continuar recibiendo medicamentos y lquidos travs de una va intravenosa (IV) que se Scientific laboratory techniciancolocar en una de sus venas.  Probablemente tenga colocado un tubo flexible de drenaje (catter) que drenar la orina de la vejiga. El  catter ser retirado tan pronto como sea apropiado.  Es posible que le den una botella rociadora para que use cuando vaya al bao. Puede utilizarla hasta que se sienta cmoda limpindose de la manera habitual. Siga los pasos a continuacin para usar la botella rociadora:  Antes de orinar, llene la botella rociadora con agua tibia. El agua debe estar tibia. No use agua caliente.  Despus de Geographical information systems officerorinar, New Jerseymientras an est sentada en el inodoro, use la botella rociadora para enjuagar el rea alrededor de la uretra y la abertura vaginal. Con esto podr limpiar cualquier rastro de orina y Lewisvillesangre.  Puede hacer esto en lugar de secarse. Cuando comience a Barrister's clerksanar, podr usar la botella rociadora antes de secarse. Asegrese de secarse suavemente.  Llene la botella rociadora con agua limpia cada vez que vaya al bao.  Deber usar apsitos sanitarios.  Le controlarn la incisin para asegurarse de que est cicatrizando correctamente. Le indicarn cuando es seguro retirar los puntos, las grapas o la cinta adhesiva para la piel. QU PUEDO ESPERAR?  Quizs no tenga necesidad de orinar durante varias horas despus del parto.  Sentir algo de dolor y Lone Grovemolestias en el abdomen. Puede tener una pequea secrecin de sangre o de lquido transparente que proviene de la incisin.  Si est amamantando, podra tener contracciones uterinas cada vez que lo haga. Estas podran prolongarse hasta varias semanas durante el puerperio. Las contracciones uterinas ayudan al tero a Hotel managerregresar a su tamao habitual.  Es normal tener un poco de hemorragia vaginal (loquios) despus del McQueeneyparto. La cantidad y apariencia de los loquios a menudo es similar a las del perodo menstrual la primera semana despus del Glassboroparto. Disminuir gradualmente las siguientes semanas hasta convertirse en una descarga seca amarronada o Sareptaamarillenta. En la Lennar Corporationmayora de las mujeres, los loquios se detienen Guardian Life Insurancecompletamente entre 6 a 8semanas despus del [place] Jacksonparto. Los  sangrados vaginales pueden variar de mujer a Nurse, learning disabilitymujer.  Los primeros [address]das despus del parto, podra padecer Waconiacongestin mamaria. Los pechos se sentirn pesados, llenos y molestos. Las mamas tambin podran latir y ponerse duras, muy tirantes, calientes y sensibles al tacto. Cuando esto Myanmarocurra, podra notar Mirantleche que se escapa de los senos.El mdico puede recomendarle algunos mtodos para Emergency planning/management officeraliviar este malestar causado por la Raylecongestin mamaria. La congestin mamaria debera desaparecer al cabo de The Mutual of Omahaunos das.  Podra sentirse ms deprimida o preocupada que lo habitual debido a los cambios hormonales luego del Candorparto. Estos sentimientos no deben durar ms de Hughes Supplyunos pocos das. Si no desaparecen al cabo de Time Warneralgunos das, hable con su mdico. QU CUIDADOS DEBO TENER?  Infrmele a su mdico si siente dolor o malestar.  Beba suficiente agua para mantener la orina clara o de color amarillo plido.  Lvese bien las manos con agua y jabn durante al menos 20segundos despus de cambiar el apsito sanitario, usar el bao y antes de Occupational psychologistsostener o Corporate treasureralimentar al beb.  Si no est amamantando, evite tocarse mucho los senos. Al hacerlo, podran producir ms WPS Resourcesleche.  Si se siente dbil o mareada, o si siente que est a punto de [address]desmayarse, pida ayuda antes de realizar lo siguiente:  Levantarse de la cama.  Ducharse.  Cambie los apsitos sanitarios con frecuencia. Observe si  hay cambios en el flujo, como un aumento repentino en el volumen, cambios en el color o cogulos sanguneos de gran tamao. Si expulsa un cogulo sanguneo por la vagina, gurdelo para mostrrselo a su mdico. No tire la cadena sin que el mdico examine el cogulo antes.  Asegrese de tener todas las vacunas al da. Esto la ayudar a Theme park manager protegida y a proteger al beb de determinadas enfermedades. Podra necesitar vacunas antes de dejar el hospital.  Si lo desea, hable con el mdico acerca de los mtodos de planificacin familiar o control de la  natalidad (mtodos anticonceptivos). CMO PUEDO ESTABLECER LAZOS CON MI BEB? Pasar tanto tiempo como le sea posible con el beb es sumamente importante. Durante ese tiempo, usted y su beb pueden conocerse y Theme park manager. Tener al beb con usted en la habitacin le dar tiempo de conocerlo. Esto tambin puede hacerla sentir ms cmoda para atender al beb. Amamantar tambin puede ayudarla a crear lazos con el beb. CMO PUEDO PLANIFICAR MI REGRESO A CASA CON EL BEB?  Asegrese de tener instalada una butaca en el automvil.  La butaca debe contar con la certificacin del fabricante para asegurarse de que est instalada en forma segura.  Asegrese de que el beb quede bien asegurado en la butaca.  Pregntele al mdico todo lo que necesite saber sobre los cuidados de su beb. Asegrese de poder comunicarse con el mdico en caso de que tenga preguntas luego de dejar el hospital. Esta informacin no tiene Theme park manager el consejo del mdico. Asegrese de hacerle al mdico cualquier pregunta que tenga. Document Released: 02/13/2012 Document Revised: 06/20/2015 Document Reviewed: 01/31/2015 Elsevier Interactive Patient Education  2017 ArvinMeritor.

## 2016-03-06 NOTE — Progress Notes (Signed)
Pt c/o dizziness since pain med given. Pt states has not slept last night via Eda in house interpreter. RN discussed not getting up without help. Encourage pt to sleep and FOB to watch infant at this time. Pt states understanding with the interpreter.

## 2016-03-06 NOTE — Discharge Summary (Signed)
OB Discharge Summary     Patient Name: Cassie Mcconnell DOB: 12/17/1985 MRN: [id number]  Date of admission: 03/03/2016 Delivering MD: Jaynie CollinsANYANWU, UGONNA A   Date of discharge: 03/06/2016  Admitting diagnosis: induction Intrauterine pregnancy: 4249w2d     Secondary diagnosis:  Principal Problem:   S/P cesarean section  Additional problems: none     Discharge diagnosis: Term Pregnancy Delivered                                                                                                Post partum procedures:none  Augmentation: Cytotec and Foley Balloon  Complications: None  Hospital course:  Induction of Labor With Cesarean Section  30 y.o. yo [id number] at 7649w2d was admitted to the hospital 03/03/2016 for induction of labor. Patient had a labor course significant for FTP. The patient went for cesarean section due to Arrest of Dilation and Non-Reassuring FHR, and delivered a Viable infant,@BABYSUPPRESS (DBLINK,ept,110,,1,,) Membrane Rupture Time/Date: )10:15 PM ,03/03/2016   @Details  of operation can be found in separate operative Note.  Patient had an uncomplicated postpartum course. She is ambulating, tolerating a regular diet, passing flatus, and urinating well.  Patient is discharged home in stable condition on 03/06/16.                                     Physical exam Vitals:   03/05/16 0200 03/05/16 0500 03/05/16 1715 03/06/16 0602  BP:  126/66 119/66 112/69  Pulse:  69 77 79  Resp: 18 18 18 18   Temp:  98.3 F (36.8 C) 98.7 F (37.1 C) 98.7 F (37.1 C)  TempSrc:  Oral Oral Oral  SpO2: 98%     Weight:      Height:       General: alert, cooperative and no distress Lochia: appropriate Uterine Fundus: firm Incision: Healing well with no significant drainage, No significant erythema, Dressing is clean, dry, and intact DVT Evaluation: No evidence of DVT seen on physical exam. Labs: Lab Results  Component Value Date   WBC 14.9 (H) 03/05/2016   HGB 8.0 (L) 03/05/2016   HCT 23.2 (L) 03/05/2016   MCV 88.5 03/05/2016   PLT 187 03/05/2016   No flowsheet data found.  Discharge instruction: per After Visit Summary and "Baby and Me Booklet".  After visit meds:  Allergies as of 03/06/2016   No Known Allergies     Medication List    TAKE these medications   multivitamin-prenatal 27-0.8 MG Tabs tablet Take 1 tablet by mouth daily at 12 noon.   oxyCODONE-acetaminophen 5-325 MG tablet Commonly known as:  PERCOCET/ROXICET Take 1 tablet by mouth every 4 (four) hours as needed (pain scale 4-7).   terconazole 0.8 % vaginal cream Commonly known as:  TERAZOL 3 Place 1 applicator vaginally at bedtime.       Diet: routine diet  Activity: Advance as tolerated. Pelvic rest for 6 weeks.   Outpatient follow up:6 weeks Follow up Appt:No future appointments. Follow up Visit:No Follow-up on file.  Postpartum contraception: Nexplanon  Newborn Data: Live born female  Birth Weight: 9 lb 7.4 oz (4292 g) APGAR: 8, 9  Baby Feeding: Bottle and Breast Disposition:home with mother   03/06/2016 Clearance CootsAndrew Tyson, MD  OB CNM attestation I have seen and examined this patient and agree with above documentation in the resident's note.   Cassie Mcconnell is a 30 y.o. [id number] s/p PLTCS.   Pain is well controlled.  Plan for birth control is Nexplanon.  Method of Feeding: breast/bottle  PE:  BP 112/69 (BP Location: Right Arm)   Pulse 79   Temp 98.7 F (37.1 C) (Oral)   Resp 18   Ht 5' (1.524 m)   Wt 172 lb (78 kg)   LMP 06/21/2015   SpO2 98%   Breastfeeding? Unknown   BMI 33.59 kg/m  Fundus firm   Recent Labs  03/04/16 1003 03/05/16 0539  HGB 11.1* 8.0*  HCT 32.3* 23.2*     Plan: discharge today - postpartum care discussed - f/u clinic in 6 weeks for postpartum visit   Rochele PagesWalidah Karim, CNM 9:01 AM

## 2016-03-06 NOTE — Progress Notes (Signed)
Pt states no dizziness at this time. Up to br without dizziness with RN assistance

## 2017-08-11 IMAGING — US US MFM OB COMP +14 WKS
1 series · 14 of 28 positions shown · non-contrast
Comparison: none

[Series 1: us mfm ob comp +14 wks · 85 acquisitions, 14 frames shown]
[im 4/85]
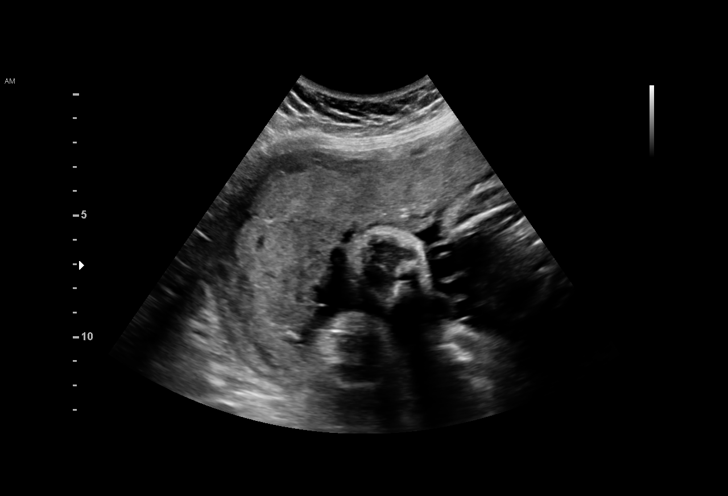
[im 10/85]
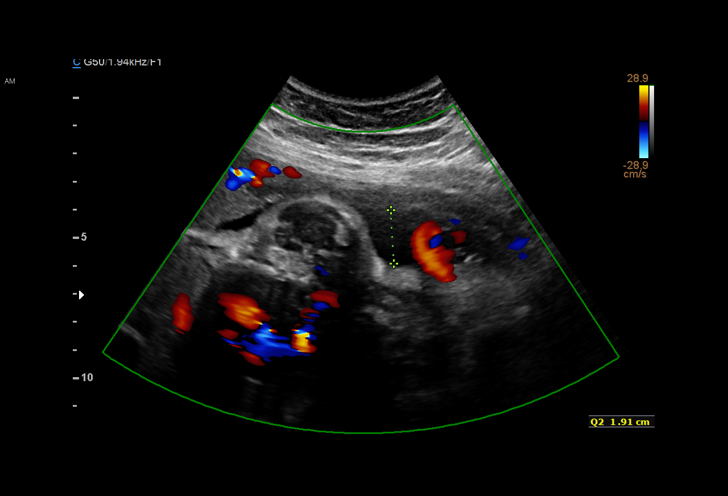
[im 16/85]
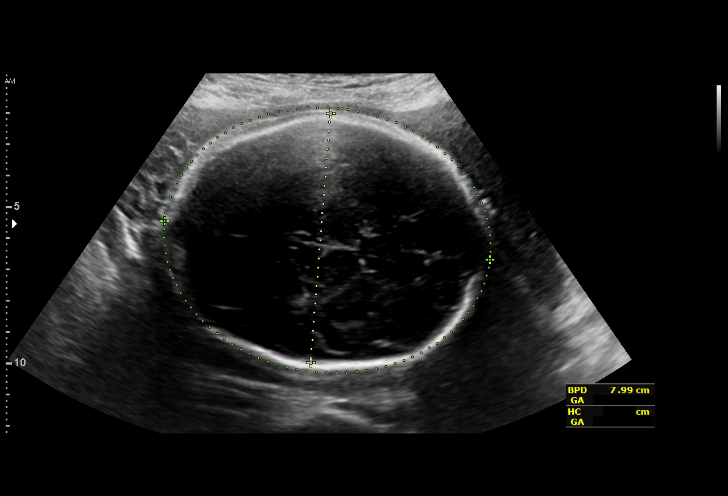
[im 22/85]
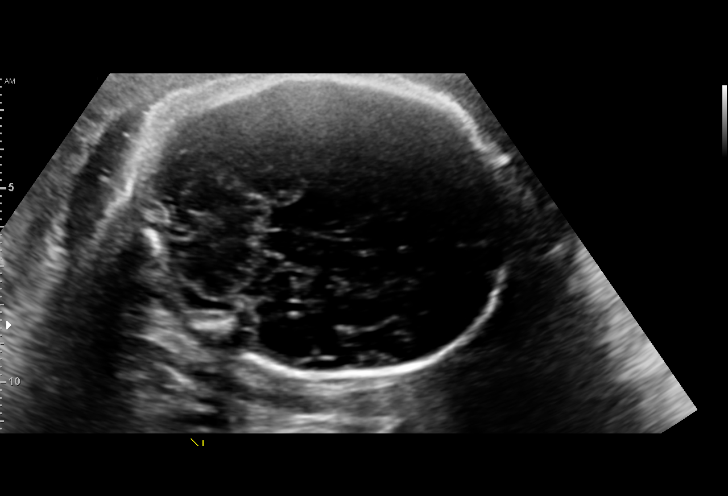
[im 29/85]
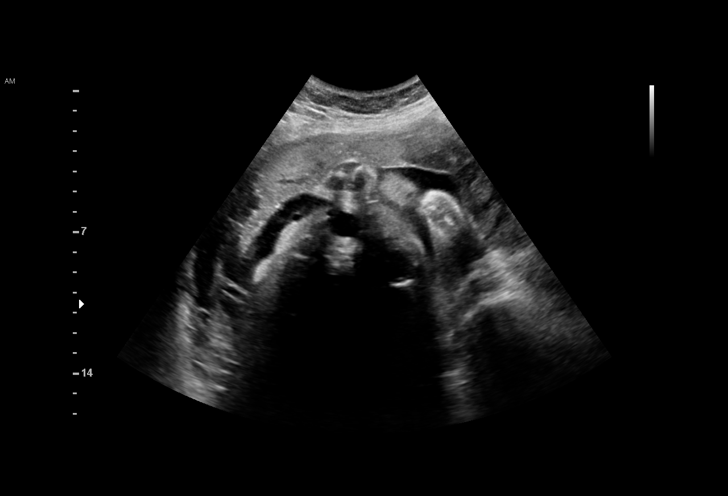
[im 35/85]
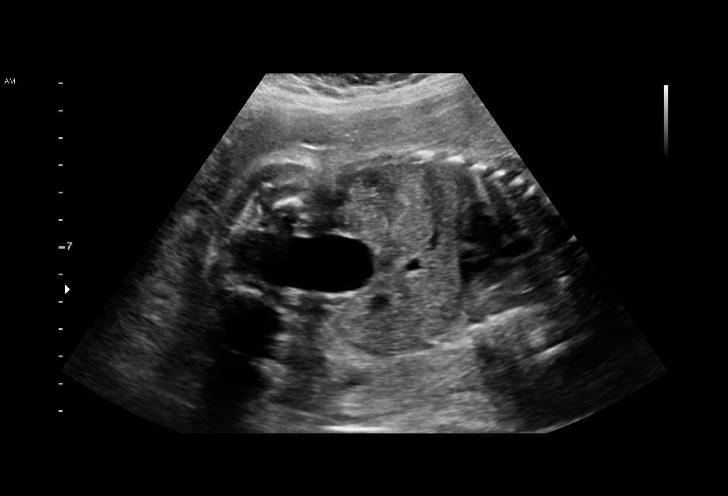
[im 41/85]
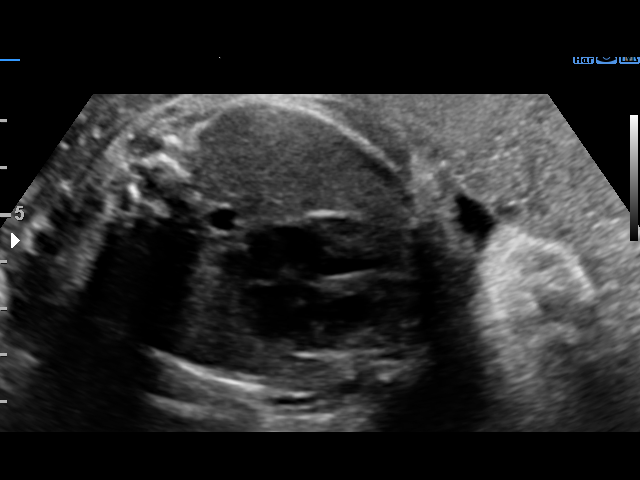
[im 47/85]
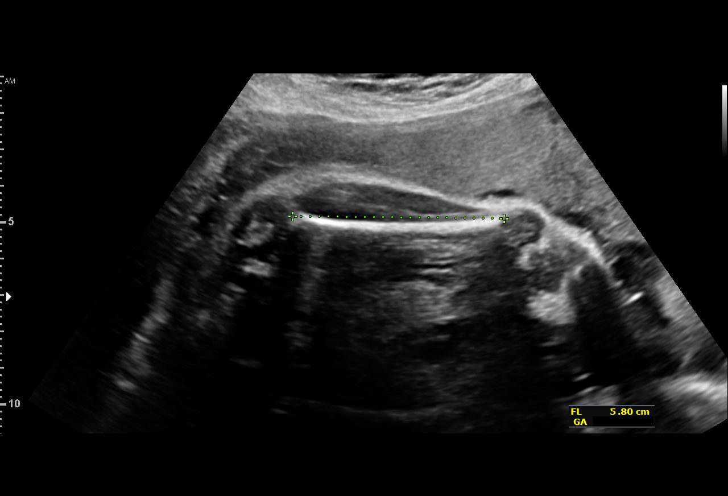
[im 53/85]
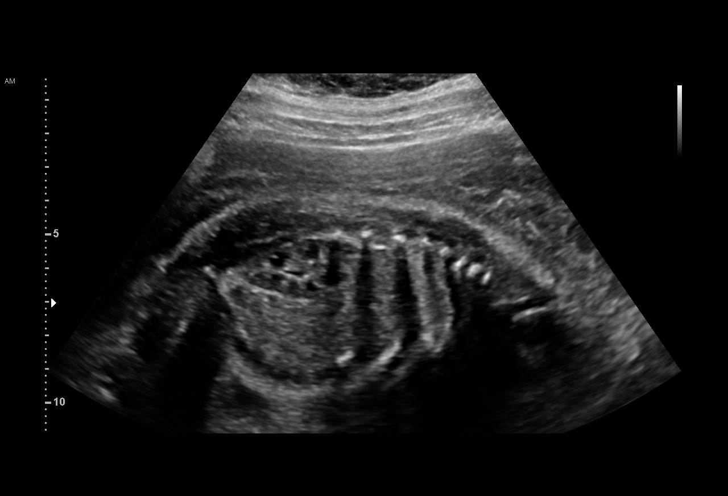
[im 60/85]
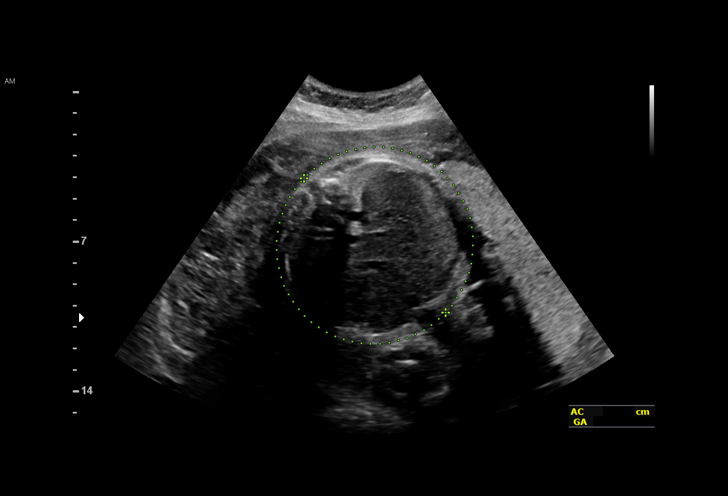
[im 66/85]
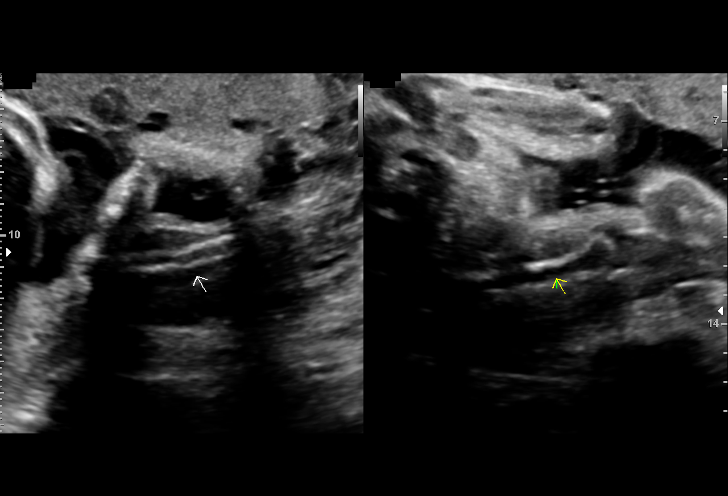
[im 72/85]
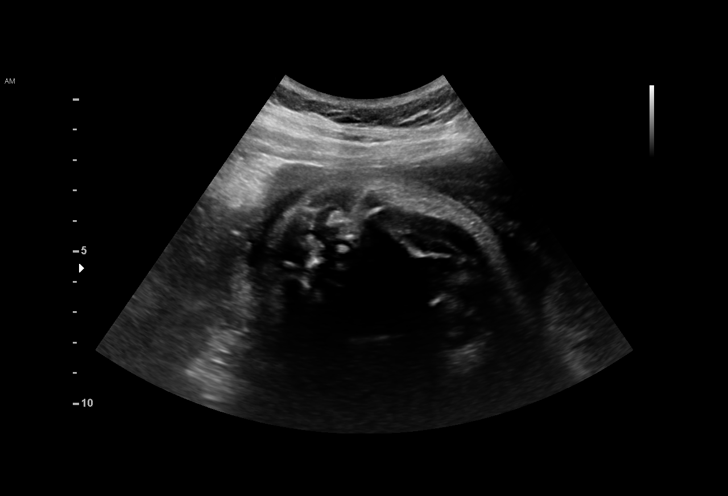
[im 78/85]
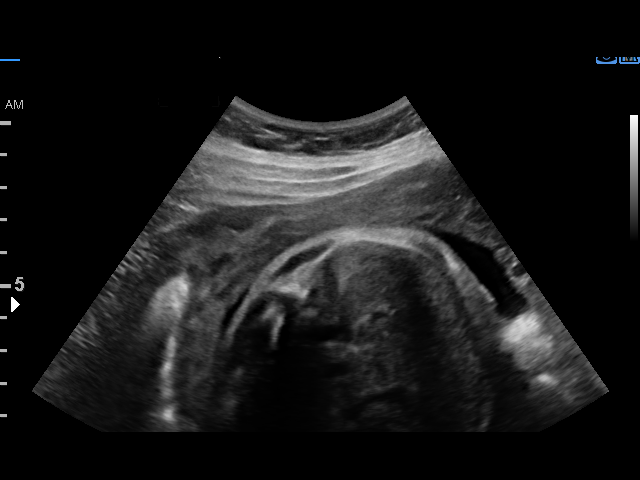
[im 85/85]
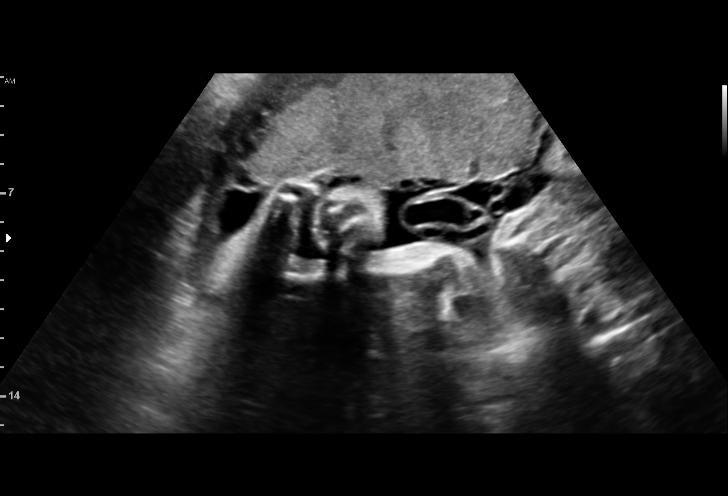

[14 of 28 positions shown; findings below may reference images not displayed]

1  [facility]                [id number]      [id number]     [id number]
JESUS DAMIAN
Indications

31 weeks gestation of pregnancy
Encounter for antenatal screening for
malformations
OB History

Blood Type:            Height:  4'10"  Weight (lb):  149      BMI:
Gravidity:    4         Term:   2        Prem:   0        SAB:   1
TOP:          0       Ectopic:  0        Living: 2
Fetal Evaluation

Num Of Fetuses:     1
Fetal Heart         170
Rate(bpm):
Cardiac Activity:   Observed
Presentation:       Cephalic
Placenta:           Anterior, above cervical os
P. Cord Insertion:  Visualized, central

Amniotic Fluid
AFI FV:      Subjectively within normal limits

AFI Sum(cm)     %Tile       Largest Pocket(cm)
12.13           32

RUQ(cm)       RLQ(cm)       LUQ(cm)        LLQ(cm)
4.59
Biometry
BPD:      79.2  mm     G. Age:  31w 5d         64  %    CI:        72.86   %   70 - 86
FL/HC:      19.7   %   19.3 -
HC:       295   mm     G. Age:  32w 4d         58  %    HC/AC:      1.04       0.96 -
AC:      284.2  mm     G. Age:  32w 3d         84  %    FL/BPD:     73.4   %   71 - 87
FL:       58.1  mm     G. Age:  30w 2d         21  %    FL/AC:      20.4   %   20 - 24

Est. FW:    9101  gm      4 lb 1 oz     71  %
Gestational Age

LMP:           26w 3d       Date:   06/21/15                 EDD:   03/27/16
U/S Today:     31w 5d                                        EDD:   02/19/16
Best:          31w 0d    Det. By:   Previous Ultrasound      EDD:   02/24/16
(08/20/15)
Anatomy

Cranium:               Appears normal         Aortic Arch:            Appears normal
Cavum:                 Appears normal         Ductal Arch:            Not well visualized
Ventricles:            Appears normal         Diaphragm:              Appears normal
Choroid Plexus:        Appears normal         Stomach:                Appears normal, left
sided
Cerebellum:            Appears normal         Abdomen:                Appears normal
Posterior Fossa:       Appears normal         Abdominal Wall:         Not well visualized
Nuchal Fold:           Not applicable (>20    Cord Vessels:           Appears normal (3
wks GA)                                        vessel cord)
Face:                  Orbits nl; profile not Kidneys:                Appear normal
well visualized
Lips:                  Appears normal         Bladder:                Appears normal
Thoracic:              Appears normal         Spine:                  Ltd views no
intracranial signs of
NT
Heart:                 Appears normal         Upper Extremities:      Appears normal
(4CH, axis, and situs
RVOT:                  Appears normal         Lower Extremities:      Appears normal
LVOT:                  Appears normal

Other:  Fetus appears to be a male. Technically difficult due to advanced
gestational age.
Cervix Uterus Adnexa

Cervix
Normal appearance by transabdominal scan.

Uterus
No abnormality visualized.

Left Ovary
Not visualized.

Right Ovary
Within normal limits.

Adnexa:       No abnormality visualized.
Impression

Single IUP at 31w 0d
Limited views of the spine, abdominal cord insertion obtained
due to late gestational age and fetal position
The remainder of the fetal anatomy appears normal
The estimated fetal weight is at the 71st %tile
Anterior placenta without previa
Normal amniotic fluid volume
Recommendations

Follow-up ultrasounds as clinically indicated.

## 2018-02-17 ENCOUNTER — Ambulatory Visit: Payer: Self-pay | Admitting: Internal Medicine

## 2018-04-10 ENCOUNTER — Ambulatory Visit: Payer: Self-pay | Admitting: Internal Medicine

## 2018-04-10 ENCOUNTER — Encounter: Payer: Self-pay | Admitting: Internal Medicine

## 2018-04-10 VITALS — BP 118/78 | HR 66 | Resp 12 | Ht 58.25 in | Wt 165.0 lb

## 2018-04-10 DIAGNOSIS — R109 Unspecified abdominal pain: Secondary | ICD-10-CM

## 2018-04-10 DIAGNOSIS — L659 Nonscarring hair loss, unspecified: Secondary | ICD-10-CM

## 2018-04-10 DIAGNOSIS — R300 Dysuria: Secondary | ICD-10-CM

## 2018-04-10 DIAGNOSIS — B977 Papillomavirus as the cause of diseases classified elsewhere: Secondary | ICD-10-CM | POA: Insufficient documentation

## 2018-04-10 LAB — POCT URINALYSIS DIPSTICK
Bilirubin, UA: NEGATIVE
Glucose, UA: NEGATIVE
KETONES UA: NEGATIVE
LEUKOCYTES UA: NEGATIVE
NITRITE UA: NEGATIVE
PH UA: 5 (ref 5.0–8.0)
Protein, UA: NEGATIVE
UROBILINOGEN UA: 0.2 U/dL

## 2018-04-10 MED ORDER — CIPROFLOXACIN HCL 500 MG PO TABS: ORAL_TABLET | ORAL | 0 refills | Status: DC

## 2018-04-10 NOTE — Progress Notes (Signed)
Subjective:    Patient ID: Cassie Mcconnell, female   DOB: [birth year], 33 y.o.   MRN: [id number]   HPI   Here to establish  1.  Lower abdominal and back pain with urinary frequency and burning for 2-3 months.   Started with mid low abdominal pain.  States initially with a period.  2 days later, developed low back pain.  States the pain radiated straight through from the low abdomen to her central low back.  When her period ended, the pain went away. A similar pain recurred about 1 week ago and she has not yet started her period, though states should start soon. Also developed urinary frequency and burning with this pain.   No vaginal discharge. No fever. Has had a urinary tract infection in past with similar symptoms.  Had Nexplanon placed 04/25/2016.  Her periods are not regular. She has scant bleeding 2-3 times monthly for 1 day.    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  Last pap was in 2018 at [facility] and was normal.  Became pregnant soon after LEEP, so did not get pap until after delivery.  Brings up hair is falling out as leaving room  No outpatient medications have been marked as taking for the 04/10/18 encounter (Office Visit) with Julieanne Manson, MD.   No Known Allergies   Past Medical History:  Diagnosis Date  . HPV (human papilloma virus) infection    LEEP in 2016    Past Surgical History:  Procedure Laterality Date  . CESAREAN SECTION N/A 03/04/2016   Procedure: CESAREAN SECTION;  Surgeon: Tereso Newcomer, MD;  Location: [facility];  Service: Obstetrics;  Laterality: N/A;  . COLPOSCOPY    . LEEP  03/2015    Family History  Problem Relation Age of Onset  . Cancer Mother        cervical  . Birth defects Brother        hole in heart  . Alcohol abuse Father   . Cirrhosis Father        Alcoholic.  Believed to be cause of death  . Heart murmur Son        possibly a PFO by history given    Social History    Socioeconomic History  . Marital status: Married    Spouse name: [name]  . Number of children: 3  . Years of education: 5  . Highest education level: 5th grade  Occupational History  . Occupation: Stage manager.  Social Needs  . Financial resource strain: Not on file  . Food insecurity:    Worry: Never true    Inability: Never true  . Transportation needs:    Medical: No    Non-medical: No  Tobacco Use  . Smoking status: Never Smoker  . Smokeless tobacco: Never Used  Substance and Sexual Activity  . Alcohol use: No  . Drug use: No  . Sexual activity: Yes    Birth control/protection: None, Implant  Lifestyle  . Physical activity:    Days per week: Not on file    Minutes per session: Not on file  . Stress: Not on file  Relationships  . Social connections:    Talks on phone: Not on file    Gets together: Not on file    Attends religious service: Not on file    Active member of club or organization: Not on file    Attends meetings of clubs or  organizations: Not on file    Relationship status: Not on file  . Intimate partner violence:    Fear of current or ex partner: Not on file    Emotionally abused: No    Physically abused: No    Forced sexual activity: Not on file  Other Topics Concern  . Not on file  Social History Narrative   Lives at home with husband and 3 children.   She is originally from [place] and husband [place] from [place]   She came to [place] in [place]      Review of Systems    Objective:   BP 118/78 (BP Location: Left Arm, Patient Position: Sitting, Cuff Size: Normal)   Pulse 66   Resp 12   Ht 4' 10.25" (1.48 m)   Wt 165 lb (74.8 kg)   LMP 03/20/2018   Breastfeeding No   BMI 34.19 kg/m   Physical Exam  NAD HEENT:  PERRL, EOMI Neck:  Supple, no adenopathy, no thyromegaly Chest:  CTA CV: RRR with normal S1 and S2, No S3, S4 or murmur.  Radial and DP pulses normal and equal Abd:  S, No HSM or mass, + BS, + suprapubic tenderness.   GU:   Old brown blood from os, no erythema, no discharge otherwise. No cervical lesion.  No CMT.  No uterine or adnexal mass or tenderness.  Wet prep: unable to see anything. Assessment & Plan  1.  Presumptive UTI  Cipro 500 mg twice daily for 7 days. Urine culture.  GC/chlamydia  2.  Concerns for hair loss:  TSH

## 2018-04-10 NOTE — Progress Notes (Signed)
Social work Barrister's clerk completed new patient screening to assess for mental health symptoms and social determinants of health. Cassie Mcconnell reported that she has been feeling lethargic and over eating at meal time. She denies any suicidal thoughts or ideations. She denied having any problems with SDOH.

## 2018-04-10 NOTE — Patient Instructions (Signed)
Drink lots of water. Call if no better in 48 hours

## 2018-04-11 LAB — TSH: TSH: 1.4 u[IU]/mL (ref 0.450–4.500)

## 2018-04-12 LAB — URINE CULTURE

## 2018-04-13 LAB — GC/CHLAMYDIA PROBE AMP
Chlamydia trachomatis, NAA: NEGATIVE
Neisseria gonorrhoeae by PCR: NEGATIVE

## 2018-05-29 ENCOUNTER — Ambulatory Visit: Payer: Self-pay | Admitting: Internal Medicine

## 2018-10-09 ENCOUNTER — Ambulatory Visit: Payer: Self-pay | Admitting: Internal Medicine

## 2018-10-09 ENCOUNTER — Other Ambulatory Visit: Payer: Self-pay

## 2018-10-09 ENCOUNTER — Encounter: Payer: Self-pay | Admitting: Internal Medicine

## 2018-10-09 VITALS — BP 122/80 | HR 74 | Resp 12 | Ht 58.25 in | Wt 164.0 lb

## 2018-10-09 DIAGNOSIS — Z9189 Other specified personal risk factors, not elsewhere classified: Secondary | ICD-10-CM

## 2018-10-09 DIAGNOSIS — G8929 Other chronic pain: Secondary | ICD-10-CM

## 2018-10-09 DIAGNOSIS — M545 Low back pain: Secondary | ICD-10-CM

## 2018-10-09 MED ORDER — CYCLOBENZAPRINE HCL 5 MG PO TABS: ORAL_TABLET | ORAL | 0 refills | Status: DC

## 2018-10-09 MED ORDER — NAPROXEN 500 MG PO TABS: 500.0000 mg | ORAL_TABLET | Freq: Two times a day (BID) | ORAL | 6 refills | Status: DC
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
## 2018-10-09 NOTE — Patient Instructions (Signed)
Heating pad for 20 minutes, then gentle stretching as discussed. Gentle stretching in chair or standing as discussed--curl down and curl back up after 20 counts. Call if you do not hear from [facility] PT clinic. Call when you have your renewed orange card for referral to dental clinic.

## 2018-10-22 ENCOUNTER — Other Ambulatory Visit: Payer: Self-pay

## 2018-10-22 ENCOUNTER — Encounter: Payer: Self-pay | Admitting: Licensed Clinical Social Worker

## 2018-10-22 ENCOUNTER — Ambulatory Visit: Payer: Self-pay | Admitting: Licensed Clinical Social Worker

## 2018-10-22 DIAGNOSIS — F410 Panic disorder [episodic paroxysmal anxiety] without agoraphobia: Secondary | ICD-10-CM

## 2018-10-22 DIAGNOSIS — F430 Acute stress reaction: Secondary | ICD-10-CM

## 2018-10-22 DIAGNOSIS — F411 Generalized anxiety disorder: Secondary | ICD-10-CM
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
## 2018-11-05 ENCOUNTER — Telehealth: Payer: Self-pay | Admitting: Licensed Clinical Social Worker

## 2018-11-28 ENCOUNTER — Telehealth: Payer: Self-pay | Admitting: Licensed Clinical Social Worker
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
## 2018-12-04 ENCOUNTER — Other Ambulatory Visit: Payer: Self-pay

## 2018-12-04 ENCOUNTER — Encounter: Payer: Self-pay | Admitting: Internal Medicine

## 2018-12-04 ENCOUNTER — Ambulatory Visit: Payer: Self-pay | Admitting: Internal Medicine

## 2018-12-04 VITALS — BP 122/70 | HR 76 | Resp 12 | Ht 58.25 in | Wt 159.0 lb

## 2018-12-04 DIAGNOSIS — Z658 Other specified problems related to psychosocial circumstances: Secondary | ICD-10-CM

## 2018-12-04 DIAGNOSIS — K047 Periapical abscess without sinus: Secondary | ICD-10-CM

## 2018-12-04 MED ORDER — PENICILLIN V POTASSIUM 250 MG PO TABS: 250.0000 mg | ORAL_TABLET | Freq: Four times a day (QID) | ORAL | 0 refills | Status: DC
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
## 2018-12-24 ENCOUNTER — Ambulatory Visit: Payer: Self-pay | Admitting: Licensed Clinical Social Worker

## 2018-12-24 ENCOUNTER — Other Ambulatory Visit: Payer: Self-pay

## 2019-01-07 ENCOUNTER — Telehealth: Payer: Self-pay | Admitting: Licensed Clinical Social Worker

## 2019-01-07 ENCOUNTER — Other Ambulatory Visit: Payer: Self-pay | Admitting: Licensed Clinical Social Worker
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
## 2019-01-07 NOTE — Telephone Encounter (Signed)
SWK has attempted to contact patient three times to confirm appointment for 01/07/2019 @ 3:30Pm.  Patient's phone has voice message stating the number has calling restrictions, and will not allow voicemail.

## 2019-01-07 NOTE — Telephone Encounter (Signed)
[employer] went to patient's home.  Children were alone, and patient was running errands. Patient contacted [facility], and verified she was okay and the children were well. [employer] contact [place] and verifies children were okay and SWK was speaking with patient.  Patient reschedule for November 6th, 2020 @3 :30PM.

## 2019-01-08 ENCOUNTER — Ambulatory Visit: Payer: Self-pay | Admitting: Internal Medicine

## 2019-01-16 ENCOUNTER — Other Ambulatory Visit: Payer: Self-pay | Admitting: Licensed Clinical Social Worker

## 2019-01-21 ENCOUNTER — Ambulatory Visit: Payer: Self-pay | Admitting: Licensed Clinical Social Worker

## 2019-01-21 ENCOUNTER — Other Ambulatory Visit: Payer: Self-pay

## 2019-01-21 DIAGNOSIS — F411 Generalized anxiety disorder: Secondary | ICD-10-CM
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
## 2019-02-11 ENCOUNTER — Other Ambulatory Visit: Payer: Self-pay | Admitting: Licensed Clinical Social Worker

## 2019-02-18 ENCOUNTER — Telehealth (INDEPENDENT_AMBULATORY_CARE_PROVIDER_SITE_OTHER): Payer: Self-pay | Admitting: Licensed Clinical Social Worker

## 2019-02-18 DIAGNOSIS — F411 Generalized anxiety disorder: Secondary | ICD-10-CM

## 2019-02-19 ENCOUNTER — Other Ambulatory Visit: Payer: Self-pay

## 2019-02-20 DIAGNOSIS — F411 Generalized anxiety disorder: Secondary | ICD-10-CM | POA: Insufficient documentation

## 2019-02-20 NOTE — Progress Notes (Signed)
   THERAPY PROGRESS NOTE  Session Time: 2:15 - 3:15  Participation Level: Active  Behavioral Response: Casual and NeatAlertAnxious  Type of Therapy: Individual Therapy  Treatment Goals addressed: Anxiety and Coping  Interventions: Supportive, Meditation for sleep hygiene and anxiety and Communication with children  Summary: Cassie Mcconnell is a 33 y.o. female who presents with anxiety. Patient reports she is feeling better and but is concerned because her job has reduced her weekly hours.  Patient reports her anxiety has increased because "I am afraid I will not be abel to pay my bills." Patient reports regardless of court order for husband not call unless it has to do with their son, he continues to call her throughout the day. Patient reports they are currently sharing custody and she does not feel entirely comfortable with her son spending weekends with him.  Patient reports she husband paid 150.00 for child support but that is the only payment her's made in 5 months. Patient reports she is feeling better overall, is sleeping better and is not as irritable.  Suicidal/Homicidal: Nowithout intent/plan  Therapist Response:  The focus of today's session was on helping the patient decrease feelings of anxiety and irritability. The main therapeutic techniques used Supportive techniques to assist patient in recognizing how she is managing effectively and safely. Therapeutic efforts also included Meditation for to improve sleep hygiene and to reduce anxiety and irritability. The importance of documentation when she is having interactions with her husband was discussed, as well as reaching out to her lawyers for a consultation.  Patient reports she would like to move because she lives very close to her husband and she does not want to bump into him.  Mental status General appearance/Behavior: Neat and Casual Eye contact: Good Motor behavior: Normal Speech: Normal, Rate:WNL and  Volume:WNL Level of consciousness: Alert Mood: Anxious Affect: Appropriate Anxiety level: Moderate Thought process: Coherent Thought content: WNL Perception: Normal Judgment: Fair Insight: Present: Fair  Plan: Return again in  weeks  Diagnosis: Generalized Anxiety Disorder   Cassie Mcconnell, Cassie Mcconnell 02/20/2019

## 2019-02-25 ENCOUNTER — Telehealth: Payer: Self-pay | Admitting: Internal Medicine

## 2019-02-25 ENCOUNTER — Other Ambulatory Visit (INDEPENDENT_AMBULATORY_CARE_PROVIDER_SITE_OTHER): Payer: Self-pay | Admitting: Internal Medicine

## 2019-02-25 DIAGNOSIS — R350 Frequency of micturition: Secondary | ICD-10-CM

## 2019-02-25 DIAGNOSIS — N898 Other specified noninflammatory disorders of vagina: Secondary | ICD-10-CM

## 2019-02-25 LAB — POCT WET PREP WITH KOH
KOH Prep POC: NEGATIVE
RBC Wet Prep HPF POC: NEGATIVE
Trichomonas, UA: NEGATIVE
Yeast Wet Prep HPF POC: NEGATIVE

## 2019-02-25 LAB — POCT URINALYSIS DIPSTICK
Bilirubin, UA: NEGATIVE
Blood, UA: NEGATIVE
Glucose, UA: NEGATIVE
Ketones, UA: NEGATIVE
Leukocytes, UA: NEGATIVE
Nitrite, UA: NEGATIVE
Protein, UA: POSITIVE — AB
Spec Grav, UA: 1.025 (ref 1.010–1.025)
Urobilinogen, UA: NEGATIVE E.U./dL — AB
pH, UA: 5 (ref 5.0–8.0)

## 2019-02-25 MED ORDER — METRONIDAZOLE 500 MG PO TABS: 500.0000 mg | ORAL_TABLET | Freq: Two times a day (BID) | ORAL | 0 refills | Status: DC

## 2019-02-25 NOTE — Progress Notes (Signed)
Patient states she is having itchy on the "lips", lower back pain, right side pelvic pain and urinary frequency.  No burning at all and no vaginal discharge  Addendum:  UA normal.  Wet prep supports BV:   Will send in Metronidazole 500 mg twice daily for 7 days.    Nicky:  Please tell her she has Bacterial vaginosis--not and STD. Urine was normal Antibiotic as above--sending to [place]  Not to drink alcohol while taking or will get sick.  Urine may turn dark and get a metal taste in mouth. Call if any problems or no improvement

## 2019-02-25 NOTE — Telephone Encounter (Signed)
Spoke with patient and discussed lab results. Patient informed has Bacterial vaginosis--not and STD. And urine was normal;  to go and get Metronidazole 500 mg Antibiotic to [place] and not to drink alcohol while taking or will get sick. Patient aware urine may turn dark and get a metal taste in mouth and to call if any problems or no improvement  Verbalized understanding

## 2019-03-03 ENCOUNTER — Telehealth: Payer: Self-pay | Admitting: Licensed Clinical Social Worker

## 2019-03-03 NOTE — Telephone Encounter (Signed)
Contacted patient to inform patient of [place] last day of employment, to terminate services and give options for continuation of care. Options for continuation of care/termination are:  . Place patient on Wait List to make an appointment with one the [facility] interns, Rudene Anda or Danae Chen in January 2021. Marland Kitchen Place patient on wait list for new LCSW. Marland Kitchen Refer patient to outside therapist or agency. . Termination of mental health service with the clinic, at this time  Patient requested to be placed on waiting list for incoming LCSW.

## 2019-03-09 ENCOUNTER — Telehealth: Payer: Self-pay | Admitting: General Practice

## 2019-03-09 NOTE — Telephone Encounter (Signed)
Pt. Called to get advise -  Started Metronidazole 500 mg on 02/25/2019-finished it on Saturday and just yesterday she started with pain when urinating and is just urinating a little bit almost every 5 minutes. Also having lower back pain since Friday but thinks it might be due to her having her menstrual period. Pt. Is asking for advise.Marland KitchenMarland Kitchen

## 2019-03-09 NOTE — Telephone Encounter (Signed)
Please schedule patient to come in for UA

## 2019-03-19 ENCOUNTER — Ambulatory Visit: Payer: Self-pay | Admitting: Internal Medicine

## 2019-03-20 NOTE — Telephone Encounter (Signed)
Pt. Schedule for UA lab 02/25/2019 at 2:30 pm

## 2019-04-19 ENCOUNTER — Encounter: Payer: Self-pay | Admitting: Internal Medicine

## 2019-05-28 ENCOUNTER — Encounter: Payer: Self-pay | Admitting: Internal Medicine

## 2019-08-27 ENCOUNTER — Encounter (HOSPITAL_COMMUNITY): Payer: Self-pay

## 2019-08-27 ENCOUNTER — Emergency Department (HOSPITAL_COMMUNITY)
Admission: EM | Admit: 2019-08-27 | Discharge: 2019-08-27 | Disposition: A | Discharge status: Home / Self Care | Payer: Self-pay | Attending: Emergency Medicine | Admitting: Emergency Medicine

## 2019-08-27 DIAGNOSIS — M545 Low back pain: Secondary | ICD-10-CM | POA: Insufficient documentation

## 2019-08-27 DIAGNOSIS — R112 Nausea with vomiting, unspecified: Secondary | ICD-10-CM | POA: Insufficient documentation

## 2019-08-27 DIAGNOSIS — Z5321 Procedure and treatment not carried out due to patient leaving prior to being seen by health care provider: Secondary | ICD-10-CM | POA: Insufficient documentation

## 2019-08-27 LAB — CBC
HCT: 38.2 % (ref 36.0–46.0)
Hemoglobin: 12.5 g/dL (ref 12.0–15.0)
MCH: 29.4 pg (ref 26.0–34.0)
MCHC: 32.7 g/dL (ref 30.0–36.0)
MCV: 89.9 fL (ref 80.0–100.0)
Platelets: 244 10*3/uL (ref 150–400)
RBC: 4.25 MIL/uL (ref 3.87–5.11)
RDW: 12.6 % (ref 11.5–15.5)
WBC: 9.7 10*3/uL (ref 4.0–10.5)
nRBC: 0 % (ref 0.0–0.2)

## 2019-08-27 LAB — COMPREHENSIVE METABOLIC PANEL
ALT: 18 U/L (ref 0–44)
AST: 16 U/L (ref 15–41)
Albumin: 4.1 g/dL (ref 3.5–5.0)
Alkaline Phosphatase: 64 U/L (ref 38–126)
Anion gap: 8 (ref 5–15)
BUN: 10 mg/dL (ref 6–20)
CO2: 25 mmol/L (ref 22–32)
Calcium: 8.9 mg/dL (ref 8.9–10.3)
Chloride: 105 mmol/L (ref 98–111)
Creatinine, Ser: 0.62 mg/dL (ref 0.44–1.00)
GFR calc Af Amer: >60 mL/min (ref >60)
GFR calc non Af Amer: >60 mL/min (ref >60)
Glucose, Bld: 100 mg/dL — ABNORMAL HIGH (ref 70–99)
Potassium: 3.6 mmol/L (ref 3.5–5.1)
Sodium: 138 mmol/L (ref 135–145)
Total Bilirubin: 0.5 mg/dL (ref 0.3–1.2)
Total Protein: 6.3 g/dL — ABNORMAL LOW (ref 6.5–8.1)

## 2019-08-27 LAB — I-STAT BETA HCG BLOOD, ED (MC, WL, AP ONLY): I-stat hCG, quantitative: <5 m[IU]/mL (ref <5)

## 2019-08-27 LAB — LIPASE, BLOOD: Lipase: 22 U/L (ref 11–51)

## 2019-08-27 MED ORDER — SODIUM CHLORIDE 0.9% FLUSH: 3.0000 mL | Freq: Once | INTRAVENOUS | Status: DC

## 2019-08-27 NOTE — ED Triage Notes (Addendum)
Pt arrives to ED w/ c/o n/v that started this morning. Pt states she vomited several times but does not know how many times. Pt denies abdominal pain, diarrhea. Pt also c/o 5/10 lower back pain over the last week, Pt endorses painful urination, denies blood in urine. Denies hx of kidney stone. LMP 08/20/2019.

## 2019-09-15 ENCOUNTER — Telehealth: Payer: Self-pay | Admitting: Internal Medicine
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
## 2020-01-23 ENCOUNTER — Inpatient Hospital Stay (HOSPITAL_COMMUNITY): Payer: Self-pay

## 2020-01-23 ENCOUNTER — Encounter (HOSPITAL_COMMUNITY): Payer: Self-pay

## 2020-01-23 ENCOUNTER — Inpatient Hospital Stay (HOSPITAL_COMMUNITY)
Admission: AD | Admit: 2020-01-23 | Discharge: 2020-01-23 | Disposition: A | Discharge status: Home / Self Care | Payer: Self-pay | Attending: Obstetrics and Gynecology | Admitting: Obstetrics and Gynecology

## 2020-01-23 ENCOUNTER — Other Ambulatory Visit: Payer: Self-pay

## 2020-01-23 DIAGNOSIS — O3680X0 Pregnancy with inconclusive fetal viability, not applicable or unspecified: Secondary | ICD-10-CM

## 2020-01-23 DIAGNOSIS — O209 Hemorrhage in early pregnancy, unspecified: Secondary | ICD-10-CM | POA: Insufficient documentation

## 2020-01-23 DIAGNOSIS — Z3A01 Less than 8 weeks gestation of pregnancy: Secondary | ICD-10-CM | POA: Insufficient documentation

## 2020-01-23 LAB — CBC
HCT: 37.7 % (ref 36.0–46.0)
Hemoglobin: 12.6 g/dL (ref 12.0–15.0)
MCH: 29.6 pg (ref 26.0–34.0)
MCHC: 33.4 g/dL (ref 30.0–36.0)
MCV: 88.5 fL (ref 80.0–100.0)
Platelets: 238 10*3/uL (ref 150–400)
RBC: 4.26 MIL/uL (ref 3.87–5.11)
RDW: 12.7 % (ref 11.5–15.5)
WBC: 7.2 10*3/uL (ref 4.0–10.5)
nRBC: 0 % (ref 0.0–0.2)

## 2020-01-23 LAB — URINALYSIS, ROUTINE W REFLEX MICROSCOPIC
Bilirubin Urine: NEGATIVE
Glucose, UA: NEGATIVE mg/dL
Ketones, ur: NEGATIVE mg/dL
Leukocytes,Ua: NEGATIVE
Nitrite: NEGATIVE
Protein, ur: NEGATIVE mg/dL
RBC / HPF: >50 RBC/hpf — ABNORMAL HIGH (ref 0–5)
Specific Gravity, Urine: 1.009 (ref 1.005–1.030)
pH: 7 (ref 5.0–8.0)

## 2020-01-23 LAB — WET PREP, GENITAL
Sperm: NONE SEEN
Trich, Wet Prep: NONE SEEN
WBC, Wet Prep HPF POC: NONE SEEN
Yeast Wet Prep HPF POC: NONE SEEN

## 2020-01-23 LAB — HCG, QUANTITATIVE, PREGNANCY: hCG, Beta Chain, Quant, S: 40 m[IU]/mL — ABNORMAL HIGH (ref <5)

## 2020-01-23 LAB — POCT PREGNANCY, URINE: Preg Test, Ur: POSITIVE — AB

## 2020-01-23 MED ORDER — ACETAMINOPHEN 500 MG PO TABS
1000.0000 mg | ORAL_TABLET | Freq: Once | ORAL | Status: AC
  Administered 2020-01-23: 1000 mg via ORAL
  Filled 2020-01-23: qty 2

## 2020-01-23 NOTE — MAU Provider Note (Addendum)
History     CSN: [id number]  Arrival date and time: 01/23/20 1043   First Provider Initiated Contact with Patient 01/23/20 1211      Chief Complaint  Patient presents with  . Vaginal Bleeding/ preg/  . Abdominal Pain  . Back Pain   HPI Cassie Mcconnell is a 34 y.o. [phone] at [redacted]w[redacted]d who presents with vaginal bleeding and abdominal pain. She reports intermittent abdominal pain for the last week and bleeding that started yesterday. She states the bleeding has gotten worse over the course of the day and is now like a period. She rates the pain in her abdomen an 8/10 and tried ibuprofen this morning at 0600 with no relief. She has not been seen anywhere for this pregnancy yet. She reports a history of a miscarriage and states it feels like that again.   OB History    Gravida  5   Para  3   Term  3   Preterm      AB  1   Living  3     SAB  1   TAB      Ectopic      Multiple  0   Live Births  3           Past Medical History:  Diagnosis Date  . HPV (human papilloma virus) infection    LEEP in 2016    Past Surgical History:  Procedure Laterality Date  . CESAREAN SECTION N/A 03/04/2016   Procedure: CESAREAN SECTION;  Surgeon: Tereso Newcomer, MD;  Location: [facility];  Service: Obstetrics;  Laterality: N/A;  . COLPOSCOPY    . LEEP  03/2015    Family History  Problem Relation Age of Onset  . Cancer Mother        cervical  . Birth defects Brother        hole in heart  . Alcohol abuse Father   . Cirrhosis Father        Alcoholic.  Believed to be cause of death  . Heart murmur Son        possibly a PFO by history given    Social History   Tobacco Use  . Smoking status: Never Smoker  . Smokeless tobacco: Never Used  Substance Use Topics  . Alcohol use: No  . Drug use: No    Allergies: No Known Allergies  Medications Prior to Admission  Medication Sig Dispense Refill Last Dose  . acetaminophen (TYLENOL) 500 MG tablet Take  500 mg by mouth every 6 (six) hours as needed.     . cyclobenzaprine (FLEXERIL) 5 MG tablet 1/2 to 1 tab by mouth at bedtime as needed for significant pain 30 tablet 0   . etonogestrel (NEXPLANON) 68 MG IMPL implant 1 each by Subdermal route once.     . metroNIDAZOLE (FLAGYL) 500 MG tablet Take 1 tablet (500 mg total) by mouth 2 (two) times daily. 14 tablet 0   . naproxen (NAPROSYN) 500 MG tablet Take 1 tablet (500 mg total) by mouth 2 (two) times daily with a meal. 60 tablet 6   . penicillin v potassium (VEETID) 250 MG tablet Take 1 tablet (250 mg total) by mouth 4 (four) times daily. 28 tablet 0   . Prenatal Vit-Fe Fumarate-FA (MULTIVITAMIN-PRENATAL) 27-0.8 MG TABS tablet Take 1 tablet by mouth daily at 12 noon.       Review of Systems  Constitutional: Negative.  Negative for fatigue and fever.  HENT:  Negative.   Respiratory: Negative.  Negative for shortness of breath.   Cardiovascular: Negative.  Negative for chest pain.  Gastrointestinal: Positive for abdominal pain. Negative for constipation, diarrhea, nausea and vomiting.  Genitourinary: Positive for vaginal discharge. Negative for dysuria.  Neurological: Negative.  Negative for dizziness and headaches.   Physical Exam   Blood pressure 114/64, pulse (!) 56, temperature 98.1 F (36.7 C), temperature source Oral, resp. rate 16, last menstrual period 12/16/2019, SpO2 100 %.  Physical Exam Vitals and nursing note reviewed.  Constitutional:      General: She is not in acute distress.    Appearance: She is well-developed.  HENT:     Head: Normocephalic.  Eyes:     Pupils: Pupils are equal, round, and reactive to light.  Cardiovascular:     Rate and Rhythm: Normal rate and regular rhythm.     Heart sounds: Normal heart sounds.  Pulmonary:     Effort: Pulmonary effort is normal. No respiratory distress.     Breath sounds: Normal breath sounds.  Abdominal:     General: Bowel sounds are normal. There is no distension.      Palpations: Abdomen is soft.     Tenderness: There is no abdominal tenderness.  Genitourinary:    Comments: SSE: Small amount of dark red blood in vault, cervix visually closed Skin:    General: Skin is warm and dry.  Neurological:     Mental Status: She is alert and oriented to person, place, and time.  Psychiatric:        Behavior: Behavior normal.        Thought Content: Thought content normal.        Judgment: Judgment normal.     MAU Course  Procedures Results for orders placed or performed during the hospital encounter of 01/23/20 (from the past 24 hour(s))  Pregnancy, urine POC     Status: Abnormal   Collection Time: 01/23/20 11:42 AM  Result Value Ref Range   Preg Test, Ur POSITIVE (A) NEGATIVE  Urinalysis, Routine w reflex microscopic Urine, Clean Catch     Status: Abnormal   Collection Time: 01/23/20 11:52 AM  Result Value Ref Range   Color, Urine YELLOW YELLOW   APPearance CLEAR CLEAR   Specific Gravity, Urine 1.009 1.005 - 1.030   pH 7.0 5.0 - 8.0   Glucose, UA NEGATIVE NEGATIVE mg/dL   Hgb urine dipstick LARGE (A) NEGATIVE   Bilirubin Urine NEGATIVE NEGATIVE   Ketones, ur NEGATIVE NEGATIVE mg/dL   Protein, ur NEGATIVE NEGATIVE mg/dL   Nitrite NEGATIVE NEGATIVE   Leukocytes,Ua NEGATIVE NEGATIVE   RBC / HPF >50 (H) 0 - 5 RBC/hpf   WBC, UA 0-5 0 - 5 WBC/hpf   Bacteria, UA RARE (A) NONE SEEN   Squamous Epithelial / LPF 0-5 0 - 5   Mucus PRESENT   Wet prep, genital     Status: Abnormal   Collection Time: 01/23/20 12:25 PM   Specimen: Cervix  Result Value Ref Range   Yeast Wet Prep HPF POC NONE SEEN NONE SEEN   Trich, Wet Prep NONE SEEN NONE SEEN   Clue Cells Wet Prep HPF POC PRESENT (A) NONE SEEN   WBC, Wet Prep HPF POC NONE SEEN NONE SEEN   Sperm NONE SEEN   CBC     Status: None   Collection Time: 01/23/20 12:44 PM  Result Value Ref Range   WBC 7.2 4.0 - 10.5 K/uL   RBC 4.26 3.87 - 5.11  MIL/uL   Hemoglobin 12.6 12.0 - 15.0 g/dL   HCT 37.1 36 - 46 %    MCV 88.5 80.0 - 100.0 fL   MCH 29.6 26.0 - 34.0 pg   MCHC 33.4 30.0 - 36.0 g/dL   RDW 06.2 69.4 - 85.4 %   Platelets 238 150 - 400 K/uL   nRBC 0.0 0.0 - 0.2 %  hCG, quantitative, pregnancy     Status: Abnormal   Collection Time: 01/23/20 12:44 PM  Result Value Ref Range   hCG, Beta Chain, Quant, S 40 (H) <5 mIU/mL   US OB LESS THAN 14 WEEKS WITH OB TRANSVAGINAL  Result Date: 01/23/2020 CLINICAL DATA:  Vaginal bleeding.  First trimester pregnancy. EXAM: OBSTETRIC <14 WK [place] AND TRANSVAGINAL OB US TECHNIQUE: Both transabdominal and transvaginal ultrasound examinations were performed for complete evaluation of the gestation as well as the maternal uterus, adnexal regions, and pelvic cul-de-sac. Transvaginal technique was performed to assess early pregnancy. COMPARISON:  None. FINDINGS: Intrauterine gestational sac: None Maternal uterus/adnexae: Endometrial thickness measures 10 mm. C-section scar noted in anterior lower uterine segment. No fibroids identified. Both ovaries are normal in appearance. No adnexal mass or abnormal free fluid identified. IMPRESSION: Pregnancy of unknown anatomic location (no intrauterine gestational sac or adnexal mass identified). Differential diagnosis includes recent spontaneous abortion, IUP too early to visualize, and non-visualized ectopic pregnancy. Recommend correlation with serial beta-hCG levels, and follow up US if warranted clinically. Electronically Signed   By: Danae Orleans M.D.   On: 01/23/2020 13:43   MDM UA, UPT CBC, HCG ABO/Rh- O Pos Wet prep and gc/chlamydia US OB Comp Less 14 weeks with Transvaginal Tylenol PO  Discussed with client the diagnosis of pregnancy of unknown anatomic location.  Three possibilities of outcome are: a healthy pregnancy that is too early to see a yolk sac to confirm the pregnancy is in the uterus, a pregnancy that is not healthy and has not developed and will not develop, and an ectopic pregnancy that is in the abdomen that  cannot be identified at this time.  And ectopic pregnancy can be a life threatening situation as a pregnancy needs to be in the uterus which is a muscle and can stretch to accommodate the growth of a pregnancy.  Other structures in the pelvis and abdomen as not muscular and do not stretch with the growth of a pregnancy.  Worst case scenario is that a structure ruptures with a growing pregnancy not in the uterus and and internal hemorrhage can be a life threatening situation.  We need to follow the progression of this pregnancy carefully.  We need to check another serum pregnancy hormone level to determine if the levels are rising appropriately  and to determine the next steps that are needed for you. Patient's questions were answered.   Assessment and Plan   1. Pregnancy of unknown anatomic location   2. Vaginal bleeding affecting early pregnancy   3. [redacted] weeks gestation of pregnancy    -Discharge home in stable condition -Abdominal pain and bleeding precautions discussed -Patient advised to follow-up with [facility] on Monday 11/15 at 1330 for repeat blood work -Patient may return to MAU as needed or if her condition were to change or worsen   Rolm Bookbinder CNM 01/23/2020, 12:11 PM

## 2020-01-23 NOTE — ED Triage Notes (Signed)
Emergency Medicine Provider OB Triage Evaluation Note  Cassie Mcconnell is a 34 y.o. female, [phone], at [redacted] wks gestation who presents to the emergency department with complaints of vaginal bleeding and lower abdominal pain.  Bleeding began yesterday, worsened today.  She reports suprapubic abdominal pain, not unilateral.  She has 4 children, has had 1 miscarriage.  No complications besides the one miscarriage.  She is no other medical problems, takes medications daily.  Does not currently have an OB/GYN.  Review of  Systems  Positive: Vaginal bleeding, abdominal pain Negative: Fever, urinary symptoms  Physical Exam  BP 122/69 (BP Location: Left Arm)   Pulse (!) 56   Temp 98.7 F (37.1 C) (Oral)   Resp 16   SpO2 100%  General: Awake, no distress  HEENT: Atraumatic  Resp: Normal effort  Cardiac: Normal rate Abd: Mild tenderness palpation of suprapubic abdomen.  No rigidity, guarding, distention.  No rebound. MSK: Moves all extremities without difficulty Neuro: Speech clear  Medical Decision Making  Pt evaluated for pregnancy concern and is stable for transfer to MAU. Pt is in agreement with plan for transfer.  11:16 AM Discussed with MAU APP, Cassie Mcconnell, who accepts patient in transfer.  Clinical Impression  No diagnosis found.     Alveria Apley, PA-C 01/23/20 1117

## 2020-01-23 NOTE — Discharge Instructions (Signed)
Abdominal Pain During Pregnancy  Belly (abdominal) pain is common during pregnancy. There are many possible causes. Most of the time, it is not a serious problem. Other times, it can be a sign that something is wrong with the pregnancy. Always tell your doctor if you have belly pain. Follow these instructions at home:  Do not have sex or put anything in your vagina until your pain goes away completely.  Get plenty of rest until your pain gets better.  Drink enough fluid to keep your pee (urine) pale yellow.  Take over-the-counter and prescription medicines only as told by your doctor.  Keep all follow-up visits as told by your doctor. This is important. Contact a doctor if:  Your pain continues or gets worse after resting.  You have lower belly pain that: ? Comes and goes at regular times. ? Spreads to your back. ? Feels like menstrual cramps.  You have pain or burning when you pee (urinate). Get help right away if:  You have a fever or chills.  You have vaginal bleeding.  You are leaking fluid from your vagina.  You are passing tissue from your vagina.  You throw up (vomit) for more than 24 hours.  You have watery poop (diarrhea) for more than 24 hours.  Your baby is moving less than usual.  You feel very weak or faint.  You have shortness of breath.  You have very bad pain in your upper belly. Summary  Belly (abdominal) pain is common during pregnancy. There are many possible causes.  If you have belly pain during pregnancy, tell your doctor right away.  Keep all follow-up visits as told by your doctor. This is important. This information is not intended to replace advice given to you by your health care provider. Make sure you discuss any questions you have with your health care provider. Document Revised: 06/16/2018 Document Reviewed: 05/31/2016 Elsevier Patient Education  2020 [employer]  

## 2020-01-23 NOTE — ED Triage Notes (Signed)
Patient complains of abdominal pain with back pain and vaginal bleeding x 1 day. G4, P3. Started yesterday. Alert and oriented, NAD

## 2020-01-23 NOTE — MAU Note (Signed)
Pt reports to mau with c/o lower back and abd pain.  Pt reports cramping and spotting yesterday, but bleeding has increased today and she is now having to wear a pad.  Pt reports last intercourse Thursday.

## 2020-01-25 ENCOUNTER — Encounter: Payer: Self-pay | Admitting: Family Medicine

## 2020-01-25 ENCOUNTER — Encounter: Payer: Self-pay | Admitting: *Deleted

## 2020-01-25 ENCOUNTER — Other Ambulatory Visit: Payer: Self-pay

## 2020-01-25 ENCOUNTER — Ambulatory Visit (INDEPENDENT_AMBULATORY_CARE_PROVIDER_SITE_OTHER): Payer: Self-pay | Admitting: *Deleted

## 2020-01-25 VITALS — BP 127/70 | HR 64 | Ht 61.0 in | Wt 167.7 lb

## 2020-01-25 DIAGNOSIS — O209 Hemorrhage in early pregnancy, unspecified: Secondary | ICD-10-CM

## 2020-01-25 DIAGNOSIS — O3680X0 Pregnancy with inconclusive fetal viability, not applicable or unspecified: Secondary | ICD-10-CM

## 2020-01-25 LAB — GC/CHLAMYDIA PROBE AMP (~~LOC~~) NOT AT ARMC
Chlamydia: NEGATIVE
Comment: NEGATIVE
Comment: NORMAL
Neisseria Gonorrhea: NEGATIVE

## 2020-01-25 LAB — BETA HCG QUANT (REF LAB): hCG Quant: 23 m[IU]/mL

## 2020-01-25 NOTE — Addendum Note (Signed)
Addended by: Marjo Bicker on: 01/25/2020 05:27 PM   Modules accepted: Level of Service

## 2020-01-25 NOTE — Progress Notes (Signed)
Pt presents for stat BHCG. Interpreter #[id number] - Sharlet Salina) used for encounter.  Pt reports back pain on Lt side - taking tylenol. She is having less bleeding than when seen @ MAU on 11/13 - now using pantiliner only. Pt was advised that she will be called with lab results and plan of care information later today. She stated that a detailed message (Spanish) can be left on her VM if she does not answer.

## 2020-01-25 NOTE — Progress Notes (Addendum)
Beta HCG today is 23, which has decreased from 40 on 01/25/20. Reviewed with Earlene Plater, MD who recommends follow up non stat beta HCG in 1 week. Called pt with [employer] interpreter Aram Beecham ID [phone]. Result and provider recommendation given; explained this appears to be a miscarriage. Front office notified to schedule lab appt for 02/01/20. Return precautions given.  Fleet Contras RN 01/25/20

## 2020-01-27 ENCOUNTER — Telehealth: Payer: Self-pay

## 2020-01-27 NOTE — Telephone Encounter (Signed)
-----   Message from Vivien Rota sent at 01/27/2020 12:09 PM EST ----- Regarding: RE: non stat beta I had Eda call her, and she said she wanted to cancel her appointment. ----- Message ----- From: Marjo Bicker, RN Sent: 01/25/2020   5:27 PM EST To: [facility] Admin Pool Subject: non stat beta                                  Please schedule pt for non stat beta hcg lab appt on 02/01/20. Thanks!

## 2020-01-27 NOTE — Progress Notes (Signed)
I have reviewed this chart and agree with the RN/CMA assessment and management.    K. Meryl Espn Zeman, M.D. Attending [facility] (Faculty Practice)   

## 2020-01-27 NOTE — Telephone Encounter (Signed)
Called pt with Spanish interpreter Eda. Explained to pt it is our recommendation that she have another beta hcg lab drawn next week. Pt states she would not like another appt due to high cost. Offered financial application; pt states she has already been given this and was denied help. Explained risks of no follow up. Pt declines appt.

## 2020-02-01 ENCOUNTER — Other Ambulatory Visit: Payer: Self-pay

## 2020-03-12 NOTE — L&D Delivery Note (Signed)
OB/GYN Faculty Practice Delivery Note  Cassie Mcconnell is a 35 y.o. [id number] s/p VBAC at [redacted]w[redacted]d. She was admitted for SOL.   ROM: 5h 45m with light meconium stained fluid GBS Status:  Negative/-- (10/13 0000) Maximum Maternal Temperature: 98.53F  Labor Progress: Initial SVE: 3.5/100. She then progressed to complete with assistance of AROM.   Delivery Date/Time: 11/11 at 2009 Delivery: Called to room and patient was complete and pushing. Head delivered LOA. No nuchal cord present. Shoulder and body delivered in usual fashion. Infant with spontaneous cry, placed on mother's abdomen, dried and stimulated. Cord clamped x 2 after 1-minute delay, and cut by FOB. Cord blood drawn. Placenta delivered spontaneously with gentle cord traction. Fundus firm with massage and Pitocin. Manual lower uterine sweep performed with small clot. Staight I&O red rubber with 100cc urine. Labia, perineum, vagina, and cervix inspected with small perineal abrasion that was hemostatic and did not require repair.  Baby Weight: pending  Placenta: 3 vessel, intact. Sent to L&D Complications: None Lacerations: None (small perineal abrasion)  EBL: 100 mL Analgesia: Epidural   Infant:  APGAR (1 MIN): 8  APGAR (5 MINS): 9  [name], DO  OB Family Medicine Fellow, [facility] for [facility], [facility] 01/20/2021, 8:39 PM

## 2020-07-07 LAB — OB RESULTS CONSOLE ABO/RH: RH Type: POSITIVE

## 2020-07-07 LAB — OB RESULTS CONSOLE HEPATITIS B SURFACE ANTIGEN: Hepatitis B Surface Ag: NEGATIVE

## 2020-07-07 LAB — OB RESULTS CONSOLE ANTIBODY SCREEN: Antibody Screen: NEGATIVE

## 2020-07-07 LAB — OB RESULTS CONSOLE RUBELLA ANTIBODY, IGM: Rubella: IMMUNE

## 2020-07-07 LAB — OB RESULTS CONSOLE HIV ANTIBODY (ROUTINE TESTING): HIV: NONREACTIVE

## 2020-07-07 LAB — OB RESULTS CONSOLE RPR: RPR: NONREACTIVE

## 2020-07-07 LAB — OB RESULTS CONSOLE GC/CHLAMYDIA: Chlamydia: NEGATIVE

## 2020-07-14 ENCOUNTER — Ambulatory Visit: Payer: Self-pay | Attending: Obstetrics and Gynecology | Admitting: *Deleted

## 2020-07-14 ENCOUNTER — Other Ambulatory Visit: Payer: Self-pay

## 2020-07-14 ENCOUNTER — Ambulatory Visit: Payer: Self-pay | Attending: Obstetrics and Gynecology | Admitting: Genetic Counselor

## 2020-07-14 DIAGNOSIS — Z315 Encounter for genetic counseling: Secondary | ICD-10-CM | POA: Insufficient documentation

## 2020-07-14 DIAGNOSIS — Z8279 Family history of other congenital malformations, deformations and chromosomal abnormalities: Secondary | ICD-10-CM

## 2020-07-14 DIAGNOSIS — O09291 Supervision of pregnancy with other poor reproductive or obstetric history, first trimester: Secondary | ICD-10-CM

## 2020-07-14 NOTE — Progress Notes (Signed)
07/14/2020  Cassie Mcconnell [address] Cassie Mcconnell Mar 06, 1986 MRN: [id number] DOV: 07/14/2020  Cassie Mcconnell presented to the [facility] for M[facility] for a genetics consultation regarding her history of a previous child with a congenital heart defect. Cassie Mcconnell presented to her appointment alone. This session was facilitated by an in-person  Spanish interpreter.   Indication for genetic counseling - Previous child with congenital heart defect  Prenatal history  Cassie Mcconnell is a [id number], 35 y.o. female. Her current pregnancy has completed [redacted]w[redacted]d (Estimated Date of Delivery: 01/22/21). Cassie Mcconnell has a 68 year old daughter, a 21 year old Mcconnell, and a 34 year old Mcconnell from prior relationships. She has also had two prior miscarriages, one occurring at 51 weeks' gestation and the other at 32 weeks' gestation. She and the father of the current pregnancy are no longer involved with one another.  Cassie Mcconnell denied exposure to environmental toxins or chemical agents. She denied the use of alcohol, tobacco or street drugs. She reported taking prenatal vitamins. She denied significant viral illnesses, fevers, and bleeding during the course of her pregnancy. Her medical and surgical histories were noncontributory.  Family History  A three generation pedigree was drafted and reviewed. The family history is remarkable for the following:  - Cassie Mcconnell's 34 year old Mcconnell was born with a congenital heart defect (CHD). She described this as a "problem with his vessel" and a "hole in his heart that didn't close when it was supposed to". He has never required surgery or treatment for his CHD. 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 See Discussion section for more details.   - Cassie Mcconnell has a sister who is "missing some of her female body organs". She is unable to have children and has  health problems related to this. She reportedly had normal development and learning.   - Cassie Mcconnell has several female first cousins who had learning difficulties and cannot walk. She had limited further information about these individuals.   -  The father of the pregnancy has a niece with heart problems; however, Cassie Mcconnell had limited further information about this.  The remaining family histories were reviewed and found to be noncontributory for birth defects, intellectual disability, recurrent pregnancy loss, and known genetic conditions. Cassie Mcconnell had limited information about portions of her and the father of the pregnancy's family history information; thus, risk assessment was limited.  The patient's ancestry is [place]. The father of the pregnancy's ancestry is [place]. Ashkenazi Jewish ancestry and consanguinity were denied. Pedigree will be scanned under Media.  Discussion   Family history of congenital heart defect:  Cassie Mcconnell was referred for genetic counseling to discuss her history of a previous child with a congenital heart defect (CHD). Cassie Mcconnell has a Mcconnell from a prior relationship who was born with a CHD. She described this as "a problem with his vessel" and "a hole that all babies have, but his never closed". He has never required any surgical intervention for his CHD and is followed by Cardiology. In addition to his CHD, Ms. Cassie Mcconnell receives special education due to learning difficulties and was also delayed in speech.   We reviewed that CHDs can be isolated or a feature of an underlying genetic condition. CHDs are most often multifactorial in etiology, but can also result from chromosome aberrations, single  gene conditions, or teratogenic exposures. CHDs occur in ~0.5% of the general population. If Cassie Mcconnell's Mcconnell's CHD is nonsyndromic, the risk of recurrence for a CHD in the current fetus would likely be ~1%  (given that her Mcconnell is a half sibling to the fetus). If however, there is an underlying genetic condition that caused her Mcconnell's CHD, the chance of recurrence for the fetus could be higher, possibly up to 50% depending on the inheritance of the condition. Ms. Cassie Mcconnell was counseled that based on her Mcconnell's history and other significant family history (see Family History section), it is possible that a genetic condition could be present in the family. I offered Ms. Cassie Mcconnell a referral to pediatric genetics, as this would be the most informative in determining if he has a genetic etiology for his symptoms. Ms. Cassie Mcconnell declined this referral at this time. She understands that without knowing the underlying etiology of her Mcconnell's CHD, risk assessment for the current pregnancy is limited.  Screening/testing options:  We reviewed available screening/testing options for CHDs and genetic conditions during the pregnancy. Firstly, Ms. Cassie Mcconnell was informed that she has the option of monitoring the pregnancy via routine ultrasounds. Given her history, Ms. Cassie Mcconnell may have her anatomy ultrasound performed here in Maternal Fetal Medicine. If there is concern for a CHD in the current pregnancy, she would be referred for a fetal echocardiogram. Ms. Cassie Mcconnell was informed that if cost is of concern, she may have her anatomy ultrasound with her OBGYN provider and be referred for a follow-up ultrasound at Maternal Fetal Medicine if indicated. Ms. Cassie Mcconnell understands that ultrasound cannot detect all possible CHDs, birth defects, or genetic conditions. She knows this first-hand, as her Mcconnell's CHD was not detected on prenatal ultrasound.   Secondly, we reviewed noninvasive prenatal screening (NIPS) as an available screening option for chromosomal aneuploidies. Specifically, NIPS analyzes cell free DNA originating from the placenta that is found in the maternal blood circulation during  pregnancy. We reviewed that this test is not diagnostic for chromosome conditions, but can provide information regarding the presence or absence of extra DNA for chromosomes 13, 18, 21, and the sex chromosomes. Thus, it would not identify or rule out all fetal aneuploidy or all genetic conditions associated with CHDs. The reported detection rate is 95-99% for trisomies 21, 18, and 13, and >70% for sex chromosome aneuploidies. The false positive rate is reported to be less than 0.1% for any of these conditions. Ms. Erlinda Solinger was also informed that analysis for the condition 22q11.2 deletion syndrome may be added onto NIPS. 22q11.2 deletion syndrome is a chromosomal condition that can be associated with CHDs, learning disabilities, and speech delays. The detection rate for 22q11.2 deletion syndrome on NIPS is ~83% and the false positive rate is <1% Laurine Blazer et al., 2022).   Finally, Ms, Analeigh Aries was counseled regarding diagnostic testing via chorionic villus sampling (CVS) or amniocentesis. We discussed the technical aspects of each procedure and quoted up to a 1 in 500 (0.2%) risk for spontaneous pregnancy loss or other adverse pregnancy outcomes as a result of either procedure. Cultured cells from either a placental or amniotic fluid sample allow for the visualization of a fetal karyotype, which can detect >99% of chromosomal aberrations. Chromosomal microarray can also be performed to identify smaller deletions or duplications of fetal chromosomal material. We discussed that a limitation of diagnostic testing is that it cannot detect all possible genetic conditions. Standard testing  ordered on CVS/amniocentesis samples include fetal chromosomal analyses. Testing for additional conditions such as single gene disorders is often not ordered unless indicated by a family history or particular ultrasound anomalies. Thus, a negative result on diagnostic testing would not be able to rule out all possible genetic  conditions or the possibility of a non-syndromic CHD in the current fetus.  After careful consideration, Cassie Mcconnell declined diagnostic testing at this time. She elected to have a sample drawn for NIPS today. Ms. Gorgeous Newlun informed me that she was recently approved for emergency Medicaid coverage through the end of the month. However, she did not yet have a Medicaid card or member ID. I offered Cassie Mcconnell NIPS through the laboratory [facility], as their NIPS option (Panorama) has the highest sensitivity for 22q11.2 deletion syndrome. Additionally, Ms. Oluwatoni Rotunno should not receive a bill for Panorama NIPS if she is covered through [place]. We discussed that even if there was a problem with her Medicaid coverage, she would likely still qualify for free testing through [facility]'s Compassionate Care Program.   Plan:  Ms. Aracelly Tencza had a sample drawn for Panorama NIPS today. Results will take 7-10 days to be returned. I will call her once her results become available. In the meantime, I will contact the [facility] Department to determine if they have record of a confirmation number or case ID for Cassie Mcconnell's Medicaid authorization. Ms. Garland Smouse also gave me permission to share her phone number with Ron Parker, the local [facility] representative who can assist with billing issues.   I counseled Cassie Mcconnell regarding the above risks and available options. The approximate face-to-face time with the genetic counselor was 50 minutes.  In summary:  Reviewed family history concerns  Mcconnell from a prior relationship with a congenital heart defect, learning disability, and speech delay  Family history also significant for other congenital anomalies, learning disabilities, and developmental delays  Discussed congenital heart defects, including information about possible causes and recurrence risks  Risk for congenital heart defect in current fetus likely  greater than population risk given family history  Exact recurrence risks depends on underlying cause of Mcconnell's heart defect. Patient declined referral to pediatric genetics for Mcconnell  Offered additional testing and screening  Patient may have anatomy ultrasound with MFM and may be referred for fetal echocardiogram if appropriate. Patient may also have initial anatomy ultrasound with OBGYN provider with referral back to MFM if there are concerns on ultrasound (given cost considerations)  Opted to have sample drawn for Panorama NIPS. We will follow results  Declined diagnostic testing   Gershon Crane, MS, [facility] Genetic Counselor

## 2020-07-15 ENCOUNTER — Encounter: Payer: Self-pay | Admitting: Genetic Counselor

## 2020-07-19 ENCOUNTER — Ambulatory Visit: Payer: Self-pay

## 2020-07-21 ENCOUNTER — Telehealth: Payer: Self-pay | Admitting: Genetic Counselor

## 2020-07-21 NOTE — Telephone Encounter (Signed)
LVM for Ms. Ezequiel Essex with the help of Spanish [address] Almira Coaster (ID# [phone]) informing her that I was calling with good news about her screening results. Requested a call back to my direct line to discuss these in more detail.    Gershon Crane, MS, [facility] Genetic Counselor

## 2020-07-21 NOTE — Telephone Encounter (Signed)
Returned Ms. Ulloa Martinez's call with the help of Spanish [employer] Mikle Bosworth (ID# [phone]) to discuss her low-risk noninvasive prenatal screening (NIPS) results. Specifically, Ms. Ezequiel Essex had Panorama NIPS through the laboratory [facility] following her genetic counseling consultation on 07/14/20 (see Genetic Counseling note for more details). These negative results demonstrated an expected representation of chromosome 21, 18, 13, and sex chromosome material. This reduced the chance for trisomies 39, 80, or 18 in the pregnancy to <1 in 10,000. This also greatly reduced the risk for triploidy and sex chromosome aneuploidies in the pregnancy. Additionally, the risk for 22q11.2 deletion syndrome in the pregnancy was reduced to 1 in 12,000. Ms. Jeannette Maddy requested to know about the expected fetal sex, which is female.  NIPS analyzes placental DNA in maternal circulation. NIPS is considered to be highly specific and sensitive, but is not considered to be a diagnostic test. We had previously reviewed the sensitivity of the screen but discussed that it may miss some cases of the conditions that it assesses for. Additionally, NIPS does not test for all genetic conditions. Diagnostic testing via chorionic villus sampling or amniocentesis is available should Ms. Tenika Keeran be interested in confirming this result. She confirmed that she had no questions about these results at this time.  Gershon Crane, MS, [facility] Genetic Counselor

## 2020-08-02 ENCOUNTER — Other Ambulatory Visit: Payer: Self-pay

## 2020-09-07 ENCOUNTER — Inpatient Hospital Stay (HOSPITAL_COMMUNITY)
Admission: AD | Admit: 2020-09-07 | Discharge: 2020-09-07 | Disposition: A | Discharge status: Home / Self Care | Payer: Self-pay | Attending: Obstetrics & Gynecology | Admitting: Obstetrics & Gynecology

## 2020-09-07 ENCOUNTER — Telehealth: Payer: Self-pay | Admitting: Advanced Practice Midwife

## 2020-09-07 ENCOUNTER — Other Ambulatory Visit: Payer: Self-pay

## 2020-09-07 DIAGNOSIS — O98512 Other viral diseases complicating pregnancy, second trimester: Secondary | ICD-10-CM | POA: Insufficient documentation

## 2020-09-07 DIAGNOSIS — J069 Acute upper respiratory infection, unspecified: Secondary | ICD-10-CM

## 2020-09-07 DIAGNOSIS — R059 Cough, unspecified: Secondary | ICD-10-CM

## 2020-09-07 DIAGNOSIS — Z28310 Unvaccinated for covid-19: Secondary | ICD-10-CM | POA: Insufficient documentation

## 2020-09-07 DIAGNOSIS — Z20822 Contact with and (suspected) exposure to covid-19: Secondary | ICD-10-CM | POA: Insufficient documentation

## 2020-09-07 DIAGNOSIS — R0981 Nasal congestion: Secondary | ICD-10-CM | POA: Insufficient documentation

## 2020-09-07 DIAGNOSIS — R051 Acute cough: Secondary | ICD-10-CM | POA: Insufficient documentation

## 2020-09-07 DIAGNOSIS — Z3A20 20 weeks gestation of pregnancy: Secondary | ICD-10-CM | POA: Insufficient documentation

## 2020-09-07 DIAGNOSIS — O99512 Diseases of the respiratory system complicating pregnancy, second trimester: Secondary | ICD-10-CM

## 2020-09-07 LAB — RESP PANEL BY RT-PCR (FLU A&B, COVID) ARPGX2
Influenza A by PCR: NEGATIVE
Influenza B by PCR: NEGATIVE
SARS Coronavirus 2 by RT PCR: NEGATIVE

## 2020-09-07 MED ORDER — BENZONATATE 100 MG PO CAPS
100.0000 mg | ORAL_CAPSULE | Freq: Three times a day (TID) | ORAL | 0 refills | Status: DC
Start: 2020-09-07 — End: 2021-01-22
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
## 2020-09-07 NOTE — MAU Note (Signed)
....  Cassie Mcconnell is a 34 y.o. at [redacted]w[redacted]d here in MAU reporting: "I have been coughing, my throat has been hurting a lot and my chest is burning, I have been vomiting after coughing, I can't eat well and I can't sleep well, and it hurts to swallow." She states she last ate last night around 2000 but threw it up right after after having a coughing attack. Last meal she kept down was yesterday around noon. She states she is able to keep fluids down. Denies diarrhea. No VB or LOF. +FM.   FHT: 152 doppler

## 2020-09-07 NOTE — Telephone Encounter (Signed)
Called patient to present her lab results from MAU visit today, 09/07/20.  Pt is COVID and influenza negative.  [employer] interpreter # [phone] used for call.  Questions answered.  Pt to take medications as prescribed and return to MAU with worsening symptoms as needed.

## 2020-09-07 NOTE — Discharge Instructions (Signed)

## 2020-10-25 LAB — OB RESULTS CONSOLE RPR
RPR: NONREACTIVE
RPR: NONREACTIVE

## 2020-10-25 LAB — OB RESULTS CONSOLE HIV ANTIBODY (ROUTINE TESTING): HIV: NONREACTIVE

## 2020-12-22 LAB — OB RESULTS CONSOLE GBS: GBS: NEGATIVE

## 2021-01-01 LAB — OB RESULTS CONSOLE GC/CHLAMYDIA: Chlamydia: POSITIVE

## 2021-01-18 ENCOUNTER — Other Ambulatory Visit: Payer: Self-pay | Admitting: Advanced Practice Midwife

## 2021-01-20 ENCOUNTER — Encounter (HOSPITAL_COMMUNITY): Payer: Self-pay | Admitting: Obstetrics and Gynecology

## 2021-01-20 ENCOUNTER — Inpatient Hospital Stay (HOSPITAL_COMMUNITY)
Admission: AD | Admit: 2021-01-20 | Discharge: 2021-01-22 | DRG: 807 | Disposition: A | Discharge status: Home / Self Care | Payer: Medicaid Other | Attending: Obstetrics & Gynecology | Admitting: Obstetrics & Gynecology

## 2021-01-20 ENCOUNTER — Inpatient Hospital Stay (HOSPITAL_COMMUNITY): Payer: Medicaid Other | Admitting: Anesthesiology

## 2021-01-20 ENCOUNTER — Other Ambulatory Visit: Payer: Self-pay

## 2021-01-20 DIAGNOSIS — F411 Generalized anxiety disorder: Secondary | ICD-10-CM | POA: Diagnosis present

## 2021-01-20 DIAGNOSIS — Z3A39 39 weeks gestation of pregnancy: Secondary | ICD-10-CM | POA: Diagnosis not present

## 2021-01-20 DIAGNOSIS — Z3490 Encounter for supervision of normal pregnancy, unspecified, unspecified trimester: Secondary | ICD-10-CM | POA: Diagnosis present

## 2021-01-20 DIAGNOSIS — Z98891 History of uterine scar from previous surgery: Secondary | ICD-10-CM

## 2021-01-20 DIAGNOSIS — O26893 Other specified pregnancy related conditions, third trimester: Secondary | ICD-10-CM | POA: Diagnosis present

## 2021-01-20 DIAGNOSIS — O34211 Maternal care for low transverse scar from previous cesarean delivery: Secondary | ICD-10-CM | POA: Diagnosis not present

## 2021-01-20 DIAGNOSIS — O34219 Maternal care for unspecified type scar from previous cesarean delivery: Principal | ICD-10-CM | POA: Diagnosis not present

## 2021-01-20 LAB — COMPREHENSIVE METABOLIC PANEL
ALT: 12 U/L (ref 0–44)
AST: 19 U/L (ref 15–41)
Albumin: 3.1 g/dL — ABNORMAL LOW (ref 3.5–5.0)
Alkaline Phosphatase: 241 U/L — ABNORMAL HIGH (ref 38–126)
Anion gap: 9 (ref 5–15)
BUN: 6 mg/dL (ref 6–20)
CO2: 18 mmol/L — ABNORMAL LOW (ref 22–32)
Calcium: 8.9 mg/dL (ref 8.9–10.3)
Chloride: 108 mmol/L (ref 98–111)
Creatinine, Ser: 0.46 mg/dL (ref 0.44–1.00)
GFR, Estimated: >60 mL/min (ref >60)
Glucose, Bld: 79 mg/dL (ref 70–99)
Potassium: 3.9 mmol/L (ref 3.5–5.1)
Sodium: 135 mmol/L (ref 135–145)
Total Bilirubin: 0.7 mg/dL (ref 0.3–1.2)
Total Protein: 5.5 g/dL — ABNORMAL LOW (ref 6.5–8.1)

## 2021-01-20 LAB — CBC
HCT: 38.6 % (ref 36.0–46.0)
Hemoglobin: 13.6 g/dL (ref 12.0–15.0)
MCH: 31.2 pg (ref 26.0–34.0)
MCHC: 35.2 g/dL (ref 30.0–36.0)
MCV: 88.5 fL (ref 80.0–100.0)
Platelets: 192 10*3/uL (ref 150–400)
RBC: 4.36 MIL/uL (ref 3.87–5.11)
RDW: 13.2 % (ref 11.5–15.5)
WBC: 14.5 10*3/uL — ABNORMAL HIGH (ref 4.0–10.5)
nRBC: 0 % (ref 0.0–0.2)

## 2021-01-20 LAB — TYPE AND SCREEN
ABO/RH(D): O POS
Antibody Screen: NEGATIVE

## 2021-01-20 MED ORDER — SOD CITRATE-CITRIC ACID 500-334 MG/5ML PO SOLN: 30.0000 mL | ORAL | Status: DC | PRN

## 2021-01-20 MED ORDER — ACETAMINOPHEN 325 MG PO TABS: 650.0000 mg | ORAL_TABLET | ORAL | Status: DC | PRN

## 2021-01-20 MED ORDER — SODIUM CHLORIDE 0.9 % IV SOLN: 250.0000 mL | INTRAVENOUS | Status: DC | PRN

## 2021-01-20 MED ORDER — LACTATED RINGERS IV SOLN
500.0000 mL | INTRAVENOUS | Status: DC | PRN
  Administered 2021-01-20: 500 mL via INTRAVENOUS

## 2021-01-20 MED ORDER — EPHEDRINE 5 MG/ML INJ: 10.0000 mg | INTRAVENOUS | Status: DC | PRN

## 2021-01-20 MED ORDER — LACTATED RINGERS IV SOLN: 500.0000 mL | Freq: Once | INTRAVENOUS | Status: DC

## 2021-01-20 MED ORDER — FENTANYL-BUPIVACAINE-NACL 0.5-0.125-0.9 MG/250ML-% EP SOLN
12.0000 mL/h | EPIDURAL | Status: DC | PRN
  Filled 2021-01-20: qty 250

## 2021-01-20 MED ORDER — LIDOCAINE HCL (PF) 1 % IJ SOLN
INTRAMUSCULAR | Status: DC | PRN
  Administered 2021-01-20: 3 mL via EPIDURAL
  Administered 2021-01-20: 10 mL via EPIDURAL
  Administered 2021-01-20: 5 mL via EPIDURAL

## 2021-01-20 MED ORDER — COCONUT OIL OIL
1.0000 "application " | TOPICAL_OIL | Status: DC | PRN
  Administered 2021-01-22: 1 via TOPICAL

## 2021-01-20 MED ORDER — OXYTOCIN BOLUS FROM INFUSION
333.0000 mL | Freq: Once | INTRAVENOUS | Status: AC
  Administered 2021-01-20: 333 mL via INTRAVENOUS

## 2021-01-20 MED ORDER — ONDANSETRON HCL 4 MG/2ML IJ SOLN: 4.0000 mg | INTRAMUSCULAR | Status: DC | PRN

## 2021-01-20 MED ORDER — SENNOSIDES-DOCUSATE SODIUM 8.6-50 MG PO TABS
2.0000 | ORAL_TABLET | ORAL | Status: DC
  Administered 2021-01-21 – 2021-01-22 (×2): 2 via ORAL
  Filled 2021-01-20: qty 2

## 2021-01-20 MED ORDER — SODIUM CHLORIDE 0.9% FLUSH: 3.0000 mL | INTRAVENOUS | Status: DC | PRN

## 2021-01-20 MED ORDER — BENZOCAINE-MENTHOL 20-0.5 % EX AERO: 1.0000 "application " | INHALATION_SPRAY | CUTANEOUS | Status: DC | PRN

## 2021-01-20 MED ORDER — PRENATAL MULTIVITAMIN CH
1.0000 | ORAL_TABLET | Freq: Every day | ORAL | Status: DC
  Administered 2021-01-21 – 2021-01-22 (×2): 1 via ORAL
  Filled 2021-01-20: qty 1

## 2021-01-20 MED ORDER — DIPHENHYDRAMINE HCL 25 MG PO CAPS: 25.0000 mg | ORAL_CAPSULE | Freq: Four times a day (QID) | ORAL | Status: DC | PRN

## 2021-01-20 MED ORDER — IBUPROFEN 600 MG PO TABS
600.0000 mg | ORAL_TABLET | Freq: Four times a day (QID) | ORAL | Status: DC
  Administered 2021-01-20 – 2021-01-22 (×7): 600 mg via ORAL
  Filled 2021-01-20 (×6): qty 1

## 2021-01-20 MED ORDER — DIBUCAINE (PERIANAL) 1 % EX OINT: 1.0000 "application " | TOPICAL_OINTMENT | CUTANEOUS | Status: DC | PRN

## 2021-01-20 MED ORDER — WITCH HAZEL-GLYCERIN EX PADS: 1.0000 "application " | MEDICATED_PAD | CUTANEOUS | Status: DC | PRN

## 2021-01-20 MED ORDER — SODIUM CHLORIDE 0.9% FLUSH: 3.0000 mL | Freq: Two times a day (BID) | INTRAVENOUS | Status: DC

## 2021-01-20 MED ORDER — ACETAMINOPHEN 325 MG PO TABS
650.0000 mg | ORAL_TABLET | ORAL | Status: DC | PRN
  Administered 2021-01-21: 650 mg via ORAL
  Filled 2021-01-20: qty 2

## 2021-01-20 MED ORDER — TETANUS-DIPHTH-ACELL PERTUSSIS 5-2.5-18.5 LF-MCG/0.5 IM SUSY: 0.5000 mL | PREFILLED_SYRINGE | Freq: Once | INTRAMUSCULAR | Status: DC

## 2021-01-20 MED ORDER — PHENYLEPHRINE 40 MCG/ML (10ML) SYRINGE FOR IV PUSH (FOR BLOOD PRESSURE SUPPORT): 80.0000 ug | PREFILLED_SYRINGE | INTRAVENOUS | Status: DC | PRN

## 2021-01-20 MED ORDER — DIPHENHYDRAMINE HCL 50 MG/ML IJ SOLN
12.5000 mg | INTRAMUSCULAR | Status: DC | PRN
  Administered 2021-01-20: 12.5 mg via INTRAVENOUS
  Filled 2021-01-20: qty 1

## 2021-01-20 MED ORDER — ONDANSETRON HCL 4 MG/2ML IJ SOLN
4.0000 mg | Freq: Four times a day (QID) | INTRAMUSCULAR | Status: DC | PRN
  Administered 2021-01-20: 4 mg via INTRAVENOUS
  Filled 2021-01-20: qty 2

## 2021-01-20 MED ORDER — OXYCODONE-ACETAMINOPHEN 5-325 MG PO TABS: 1.0000 | ORAL_TABLET | ORAL | Status: DC | PRN

## 2021-01-20 MED ORDER — MEASLES, MUMPS & RUBELLA VAC IJ SOLR: 0.5000 mL | Freq: Once | INTRAMUSCULAR | Status: DC

## 2021-01-20 MED ORDER — FENTANYL CITRATE (PF) 100 MCG/2ML IJ SOLN
100.0000 ug | INTRAMUSCULAR | Status: DC | PRN
  Administered 2021-01-20 (×2): 100 ug via INTRAVENOUS
  Filled 2021-01-20 (×2): qty 2

## 2021-01-20 MED ORDER — MEDROXYPROGESTERONE ACETATE 150 MG/ML IM SUSP: 150.0000 mg | INTRAMUSCULAR | Status: DC | PRN

## 2021-01-20 MED ORDER — ZOLPIDEM TARTRATE 5 MG PO TABS: 5.0000 mg | ORAL_TABLET | Freq: Every evening | ORAL | Status: DC | PRN

## 2021-01-20 MED ORDER — OXYCODONE-ACETAMINOPHEN 5-325 MG PO TABS: 2.0000 | ORAL_TABLET | ORAL | Status: DC | PRN

## 2021-01-20 MED ORDER — SIMETHICONE 80 MG PO CHEW: 80.0000 mg | CHEWABLE_TABLET | ORAL | Status: DC | PRN

## 2021-01-20 MED ORDER — FENTANYL-BUPIVACAINE-NACL 0.5-0.125-0.9 MG/250ML-% EP SOLN
EPIDURAL | Status: DC | PRN
  Administered 2021-01-20: 12 mL/h via EPIDURAL

## 2021-01-20 MED ORDER — LACTATED RINGERS IV SOLN: INTRAVENOUS | Status: DC

## 2021-01-20 MED ORDER — LIDOCAINE HCL (PF) 1 % IJ SOLN: 30.0000 mL | INTRAMUSCULAR | Status: DC | PRN

## 2021-01-20 MED ORDER — OXYTOCIN-SODIUM CHLORIDE 30-0.9 UT/500ML-% IV SOLN
2.5000 [IU]/h | INTRAVENOUS | Status: DC
  Filled 2021-01-20: qty 500

## 2021-01-20 MED ORDER — ONDANSETRON HCL 4 MG PO TABS: 4.0000 mg | ORAL_TABLET | ORAL | Status: DC | PRN

## 2021-01-20 NOTE — Lactation Note (Addendum)
This note was copied from a baby's chart. Lactation Consultation Note  Patient Name: Cassie Mcconnell ONGEX'B Date: 01/20/2021 Reason for consult: L&D Initial assessment;Mother's request;Term Age:34 hours  Nettie Elm served as Engineer, structural during the visit.   LC assisted with latching changing position from cradle to cross cradle and bringing infant tummy to tummy.  Mom stated preference for infant breast bottle with formula.  LC to continue support on the floor.   Maternal Data Has patient been taught Hand Expression?: Yes  Feeding Mother's Current Feeding Choice: Breast Milk and Formula  LATCH Score Latch: Repeated attempts needed to sustain latch, nipple held in mouth throughout feeding, stimulation needed to elicit sucking reflex.  Audible Swallowing: Spontaneous and intermittent  Type of Nipple: Everted at rest and after stimulation  Comfort (Breast/Nipple): Soft / non-tender  Hold (Positioning): Assistance needed to correctly position infant at breast and maintain latch.  LATCH Score: 8   Lactation Tools Discussed/Used    Interventions Interventions: Breast feeding basics reviewed;Support pillows;Education;Assisted with latch;Skin to skin;Expressed milk;Hand express;Breast compression;Adjust position  Discharge [facility] Program: Yes  Consult Status Consult Status: Follow-up from L&D Date: 01/21/21 Follow-up type: In-patient    Ivon Roedel  Nicholson-Springer 01/20/2021, 9:25 PM

## 2021-01-20 NOTE — MAU Note (Signed)
Presents for c/o ctxs, states ctxs began this morning @ 0200 but got more intense 1 hours ago.  Denies LOF or VB.  Endorses +FM.

## 2021-01-20 NOTE — Progress Notes (Signed)
Cephalic via [facility]US.  Clayton Bibles, MSA, MSN, CNM Certified Nurse Midwife, Biochemist, clinical for [employer], [facility]
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
## 2021-01-20 NOTE — Anesthesia Preprocedure Evaluation (Signed)
Anesthesia Evaluation  Patient identified by MRN, date of birth, ID band Patient awake    Reviewed: Allergy & Precautions, H&P , NPO status , Patient's Chart, lab work & pertinent test results  History of Anesthesia Complications Negative for: history of anesthetic complications  Airway Mallampati: II  TM Distance: >3 FB     Dental   Pulmonary neg pulmonary ROS,    Pulmonary exam normal        Cardiovascular negative cardio ROS   Rhythm:regular Rate:Normal     Neuro/Psych negative neurological ROS  negative psych ROS   GI/Hepatic negative GI ROS, Neg liver ROS,   Endo/Other  negative endocrine ROS  Renal/GU negative Renal ROS  negative genitourinary   Musculoskeletal   Abdominal   Peds  Hematology negative hematology ROS (+)   Anesthesia Other Findings   Reproductive/Obstetrics (+) Pregnancy (TOLAC)                             Anesthesia Physical Anesthesia Plan  ASA: 2  Anesthesia Plan: Epidural   Post-op Pain Management:    Induction:   PONV Risk Score and Plan:   Airway Management Planned:   Additional Equipment:   Intra-op Plan:   Post-operative Plan:   Informed Consent: I have reviewed the patients History and Physical, chart, labs and discussed the procedure including the risks, benefits and alternatives for the proposed anesthesia with the patient or authorized representative who has indicated his/her understanding and acceptance.       Plan Discussed with:   Anesthesia Plan Comments:         Anesthesia Quick Evaluation

## 2021-01-20 NOTE — Anesthesia Procedure Notes (Signed)
Epidural Patient location during procedure: OB Start time: 01/20/2021 4:41 PM End time: 01/20/2021 5:05 PM  Staffing Anesthesiologist: Lucretia Kern, MD Performed: anesthesiologist   Preanesthetic Checklist Completed: patient identified, IV checked, risks and benefits discussed, monitors and equipment checked, pre-op evaluation and timeout performed  Epidural Patient position: sitting Prep: DuraPrep Patient monitoring: heart rate, continuous pulse ox and blood pressure Approach: midline Location: L3-L4 Injection technique: LOR air  Needle:  Needle type: Tuohy  Needle gauge: 17 G Needle length: 9 cm Needle insertion depth: 6 cm Catheter type: closed end flexible Catheter size: 19 Gauge Catheter at skin depth: 11 cm Test dose: negative  Assessment Events: blood aspirated, injection not painful, no injection resistance, no paresthesia and negative IV test  Additional Notes Initial attempt at L3-4 with good LOR. No heme visible via needle and 10 cc 1% lidocaine bolused via needle. After threading catheter, blood freely aspirated through catheter. Catheter removed and flushed to remove blood and second attempt made at same entry point. Again good LOR, catheter threaded easily, but unable to bolus due to high pressure, likely from residual blood in the catheter. Catheter again removed and third attempt made at slightly different entry point with new catheter. Again crisp LOR, catheter threaded easily, no heme aspirated. Additional lidocaine bolused through catheter easily. Pt tolerated well throughout.Reason for block:procedure for pain

## 2021-01-20 NOTE — Discharge Summary (Signed)
Postpartum Discharge Summary     Patient Name: Cassie Mcconnell DOB: Nov 20, 1985 MRN: [id number]  Date of admission: 01/20/2021 Delivery date:01/20/2021  Delivering provider: Patriciaann Clan  Date of discharge: 01/22/2021  Admitting diagnosis: Encounter for elective induction of labor [Z34.90] Intrauterine pregnancy: [redacted]w[redacted]d    Secondary diagnosis:  Principal Problem:   VBAC (vaginal birth after Cesarean) Active Problems:   Generalized anxiety disorder   Encounter for elective induction of labor   History of C-section  Additional problems: None    Discharge diagnosis: Term Pregnancy Delivered                                              Post partum procedures: None Augmentation: AROM Complications: None  Hospital course: Onset of Labor With Vaginal Delivery      35y.o. yo [id number] admitted in Latent Labor on 01/20/2021. Patient had an uncomplicated labor course as follows:  Membrane Rupture Time/Date: 2:32 PM ,01/20/2021   Delivery Method:VBAC, Spontaneous  Episiotomy: None  Lacerations:  None  Patient had an uncomplicated postpartum course.  She is ambulating, tolerating a regular diet, passing flatus, and urinating well. Patient is discharged home in stable condition on 01/22/21.  Newborn Data: Birth date:01/20/2021  Birth time:8:09 PM  Gender:Female  Living status:Living  Apgars:8 ,9  Weight:3310 g   Magnesium Sulfate received: No BMZ received: No Rhophylac: No MMR: No T-DaP: Given prenatally Flu: No Transfusion: No  Physical exam  Vitals:   01/21/21 1156 01/21/21 1500 01/21/21 2106 01/22/21 0544  BP: 114/75 119/68 118/71 120/67  Pulse: (!) 54 67 61 (!) 58  Resp: 18 18 18 18   Temp: 98.5 F (36.9 C) 98 F (36.7 C) 98.5 F (36.9 C) 98.7 F (37.1 C)  TempSrc: Oral Oral Oral Oral  SpO2:  99%    Weight:      Height:       General: alert, cooperative, and no distress Lochia: appropriate Uterine Fundus: firm and below  umbilicus DVT Evaluation: no LE edema or calf tenderness to palpation  Labs: Lab Results  Component Value Date   WBC 14.5 (H) 01/20/2021   HGB 13.6 01/20/2021   HCT 38.6 01/20/2021   MCV 88.5 01/20/2021   PLT 192 01/20/2021   CMP Latest Ref Rng & Units 01/20/2021  Glucose 70 - 99 mg/dL 79  BUN 6 - 20 mg/dL 6  Creatinine 0.44 - 1.00 mg/dL 0.46  Sodium 135 - 145 mmol/L 135  Potassium 3.5 - 5.1 mmol/L 3.9  Chloride 98 - 111 mmol/L 108  CO2 22 - 32 mmol/L 18(L)  Calcium 8.9 - 10.3 mg/dL 8.9  Total Protein 6.5 - 8.1 g/dL 5.5(L)  Total Bilirubin 0.3 - 1.2 mg/dL 0.7  Alkaline Phos 38 - 126 U/L 241(H)  AST 15 - 41 U/L 19  ALT 0 - 44 U/L 12   Edinburgh Score: Edinburgh Postnatal Depression Scale Screening Tool 01/21/2021  I have been able to laugh and see the funny side of things. 0  I have looked forward with enjoyment to things. 0  I have blamed myself unnecessarily when things went wrong. 1  I have been anxious or worried for no good reason. 0  I have felt scared or panicky for no good reason. 0  Things have been getting on top of me. 0  I have  been so unhappy that I have had difficulty sleeping. 0  I have felt sad or miserable. 0  I have been so unhappy that I have been crying. 0  The thought of harming myself has occurred to me. 0  Edinburgh Postnatal Depression Scale Total 1     After visit meds:  Allergies as of 01/22/2021   No Known Allergies      Medication List     STOP taking these medications    azithromycin 500 MG tablet Commonly known as: ZITHROMAX   benzonatate 100 MG capsule Commonly known as: TESSALON       TAKE these medications    acetaminophen 500 MG tablet Commonly known as: TYLENOL Take 2 tablets (1,000 mg total) by mouth every 8 (eight) hours as needed (pain). What changed:  how much to take when to take this reasons to take this   ibuprofen 600 MG tablet Commonly known as: ADVIL Take 1 tablet (600 mg total) by mouth every 6  (six) hours as needed (pain).   multivitamin-prenatal 27-0.8 MG Tabs tablet Take 1 tablet by mouth daily at 12 noon.         Discharge home in stable condition Infant Feeding: Breast Infant Disposition:home with mother Discharge instruction: per After Visit Summary and Postpartum booklet. Activity: Advance as tolerated. Pelvic rest for 6 weeks.  Diet: routine diet  Follow up Visit: Patient received care at HD, no postpartum message was sent.   Delivery mode:  VBAC, Spontaneous  Anticipated Birth Control:  Nexplanon at HD   01/22/2021 Genia Del, MD

## 2021-01-21 ENCOUNTER — Inpatient Hospital Stay (HOSPITAL_COMMUNITY): Admission: AD | Admit: 2021-01-21 | Payer: Self-pay | Source: Home / Self Care | Admitting: Obstetrics & Gynecology

## 2021-01-21 ENCOUNTER — Inpatient Hospital Stay (HOSPITAL_COMMUNITY): Payer: Self-pay

## 2021-01-21 LAB — RPR: RPR Ser Ql: NONREACTIVE

## 2021-01-21 NOTE — Anesthesia Postprocedure Evaluation (Signed)
Anesthesia Post Note  Patient: Cassie Mcconnell  Procedure(s) Performed: AN AD HOC LABOR EPIDURAL     Patient location during evaluation: Mother Baby Anesthesia Type: Epidural Level of consciousness: awake and alert Pain management: pain level controlled Vital Signs Assessment: post-procedure vital signs reviewed and stable Respiratory status: spontaneous breathing, nonlabored ventilation and respiratory function stable Cardiovascular status: stable Postop Assessment: no headache, no backache and epidural receding Anesthetic complications: no   No notable events documented.  Last Vitals:  Vitals:   01/21/21 0500 01/21/21 0809  BP: 117/72 109/87  Pulse: 67 (!) 57  Resp: 18 18  Temp: 36.8 C 36.7 C  SpO2: 100%     Last Pain:  Vitals:   01/21/21 0809  TempSrc: Oral  PainSc:    Pain Goal:                   [name]

## 2021-01-21 NOTE — Progress Notes (Signed)
Post Partum Day 1 Subjective: no complaints, up ad lib, voiding, tolerating PO, and + flatus  Objective: Blood pressure 117/72, pulse 67, temperature 98.3 F (36.8 C), temperature source Oral, resp. rate 18, height 5\' 1"  (1.549 m), weight 74.8 kg, last menstrual period 12/16/2019, SpO2 100 %, unknown if currently breastfeeding.  Physical Exam:  General: alert, cooperative, and no distress Lochia: appropriate Uterine Fundus: firm Incision: n/a DVT Evaluation: No evidence of DVT seen on physical exam.  Recent Labs    01/20/21 1319  HGB 13.6  HCT 38.6    Assessment/Plan: Patient doing well, reports feeling good since delivery. Ambulating without difficulty. Given late delivery time, will plan for d/c tomorrow am. Planning outpatient nexplanon   LOS: 1 day   13/11/22 CNM 01/21/2021, 5:30 AM
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
## 2021-01-22 MED ORDER — ACETAMINOPHEN 500 MG PO TABS
1000.0000 mg | ORAL_TABLET | Freq: Three times a day (TID) | ORAL | 0 refills | Status: AC | PRN
Start: 2021-01-22 — End: ?

## 2021-01-22 MED ORDER — IBUPROFEN 600 MG PO TABS: 600.0000 mg | ORAL_TABLET | Freq: Four times a day (QID) | ORAL | 0 refills | Status: AC | PRN

## 2021-01-31 ENCOUNTER — Telehealth (HOSPITAL_COMMUNITY): Payer: Self-pay

## 2021-01-31 NOTE — Telephone Encounter (Signed)
"  I am feeling good." Patient has no questions or concerns about her healing.  "He is doing good. Eating and gaining weight well. Baby sleeps in his crib." RN reviewed ABC's of safe sleep with patient. Patient has no questions or concerns about baby.  EPDS score is 3.  Signe Colt 01/31/21,1827

## 2021-09-11 IMAGING — US US OB < 14 WEEKS - US OB TV
1 series · 15 of 28 positions shown · non-contrast
Comparison: None.

CLINICAL DATA: Vaginal bleeding.  First trimester pregnancy.

EXAM:
OBSTETRIC <14 WK US AND TRANSVAGINAL OB US
TECHNIQUE: Both transabdominal and transvaginal ultrasound examinations were
performed for complete evaluation of the gestation as well as the
maternal uterus, adnexal regions, and pelvic cul-de-sac.
Transvaginal technique was performed to assess early pregnancy.

[Series 1: us ob < 14 weeks - us ob tv · 42 acquisitions, 15 frames shown]
[im 1/42]
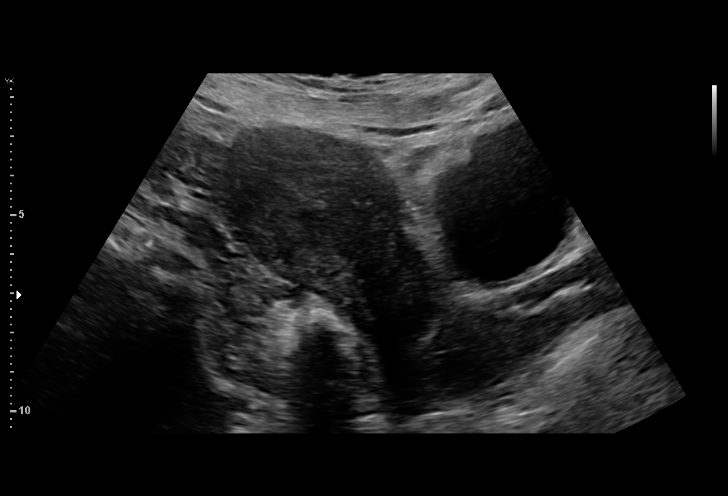
[im 4/42]
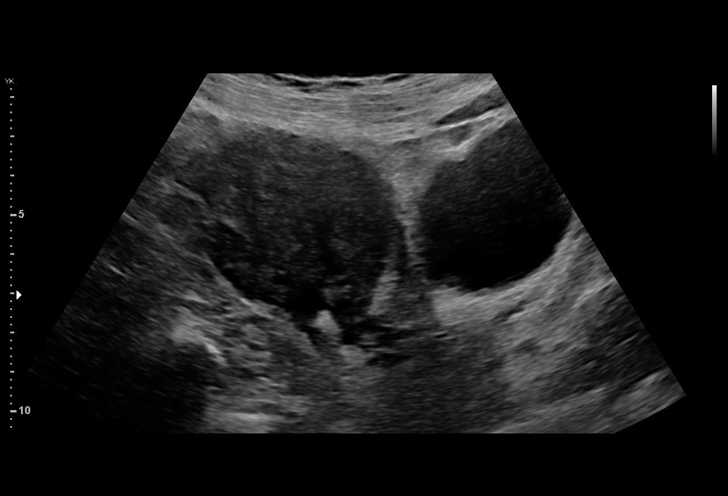
[im 7/42]
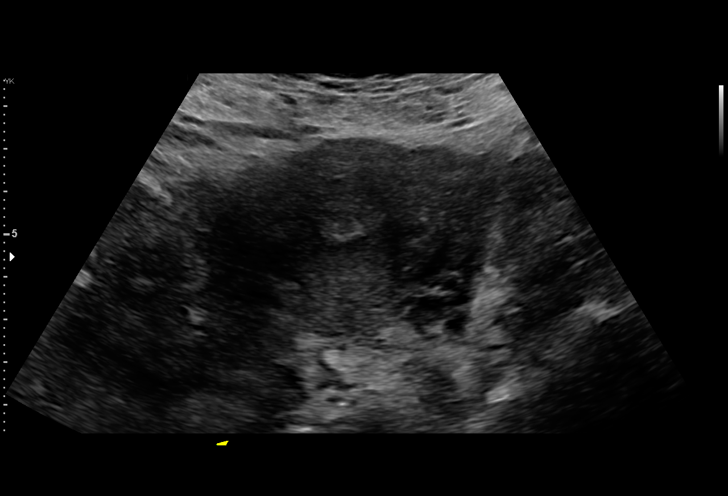
[im 10/42]
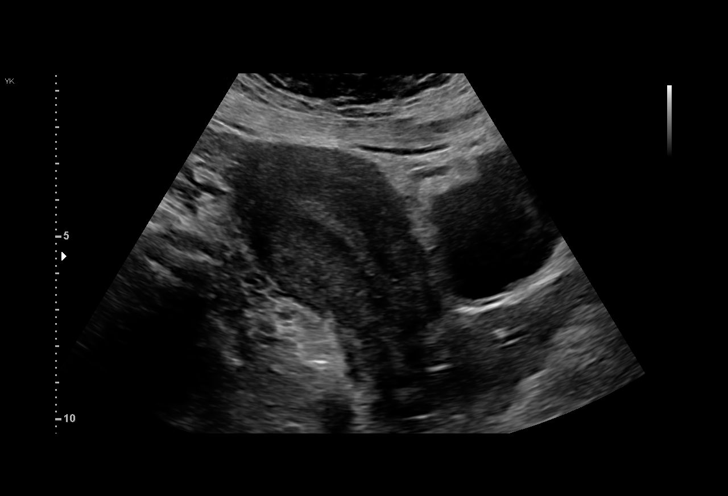
[im 13/42]
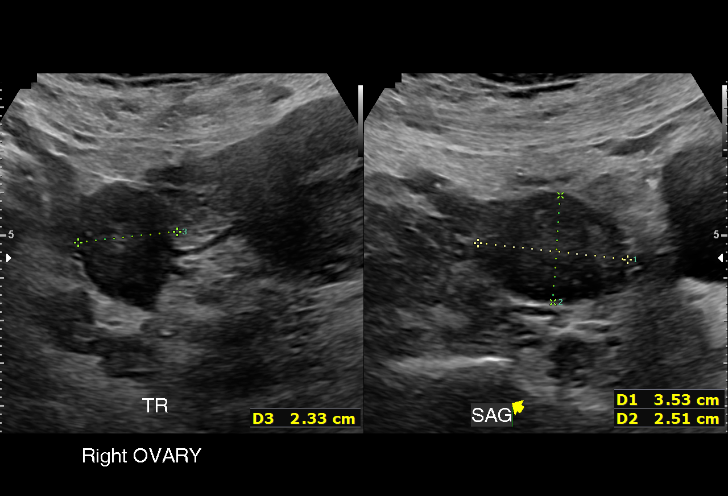
[im 16/42]
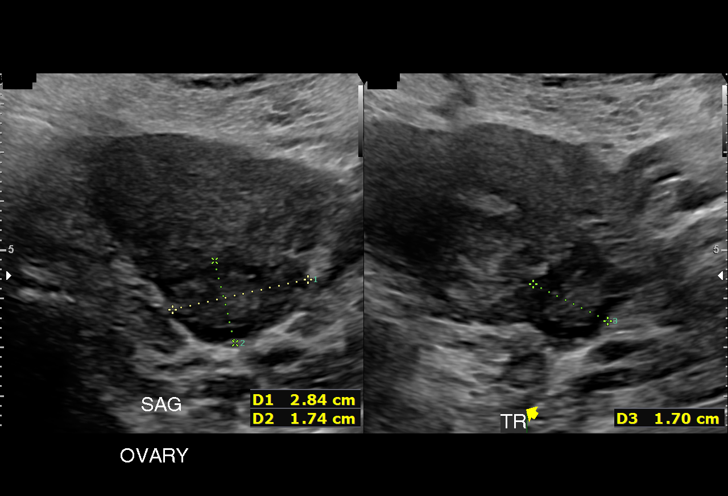
[im 19/42]
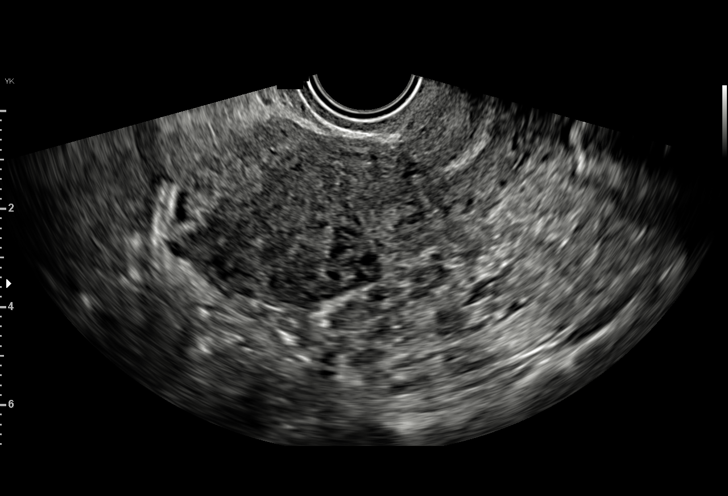
[im 22/42]
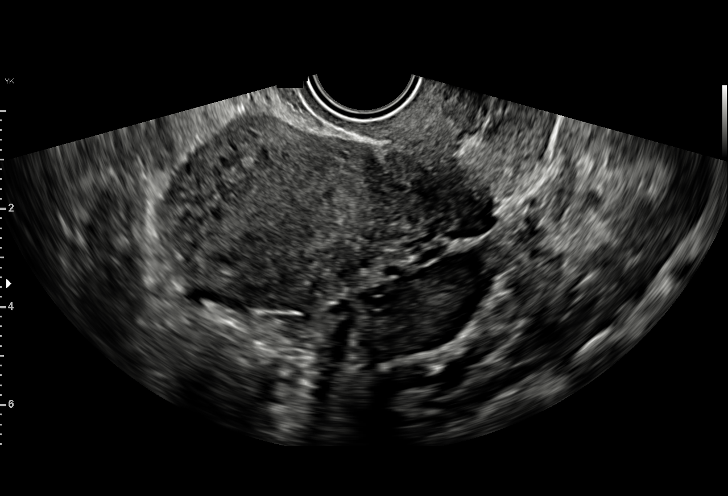
[im 23/42]
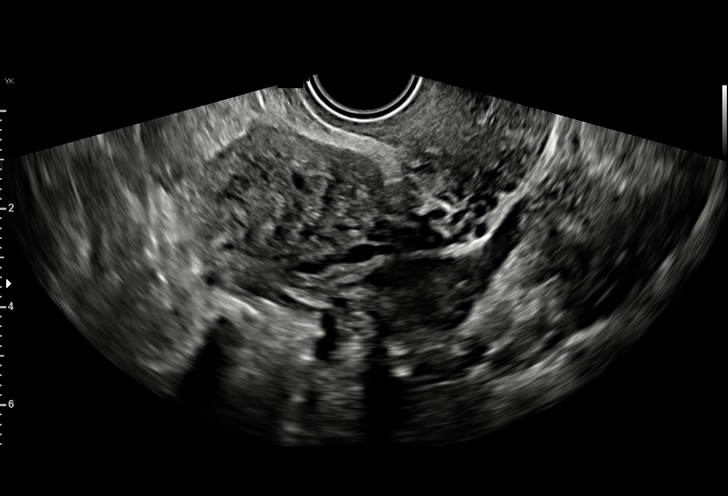
[im 26/42]
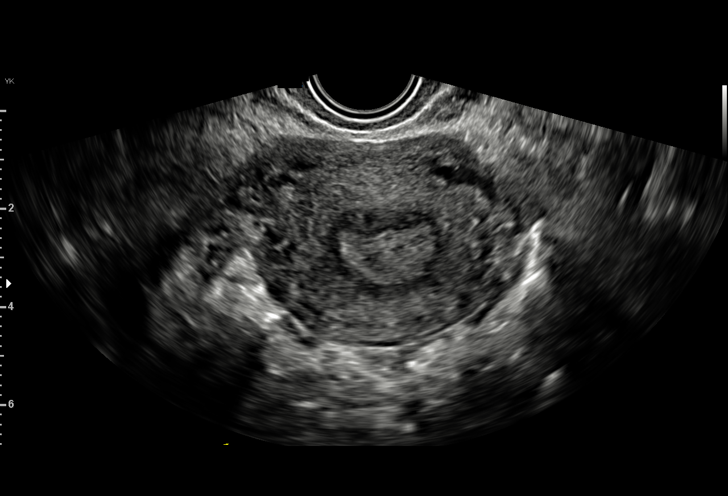
[im 29/42]
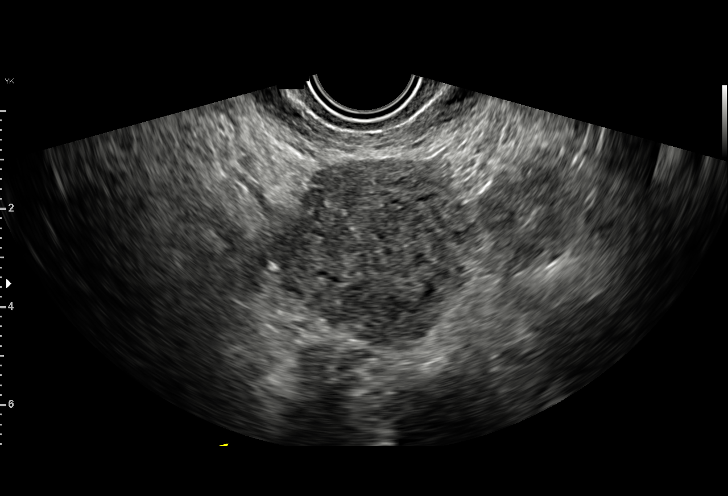
[im 32/42]
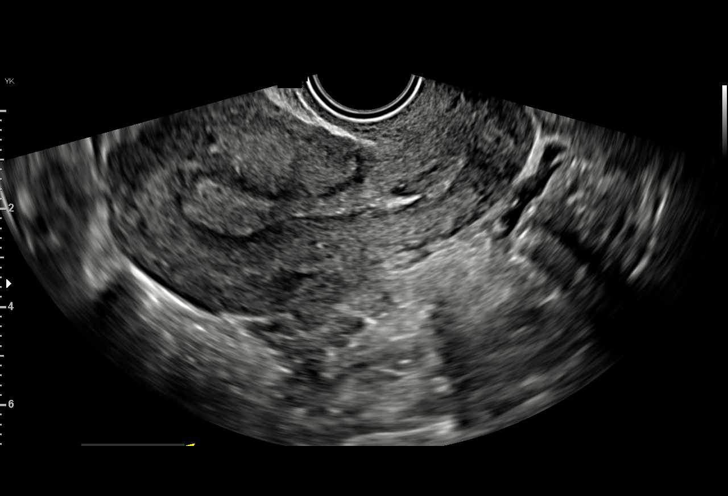
[im 35/42]
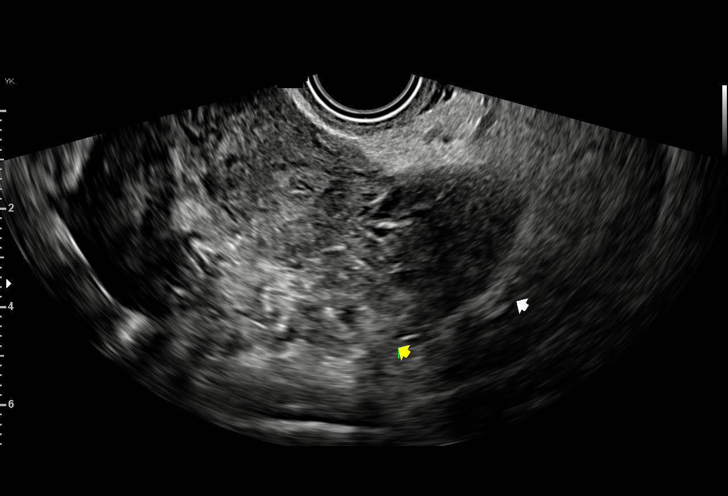
[im 38/42]
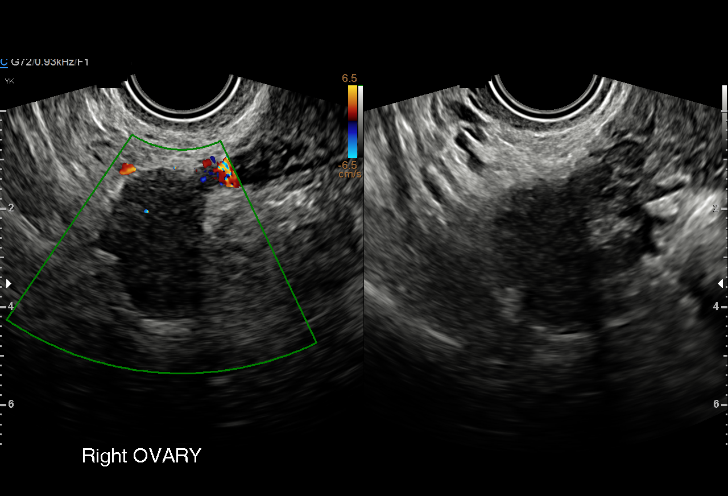
[im 42/42]
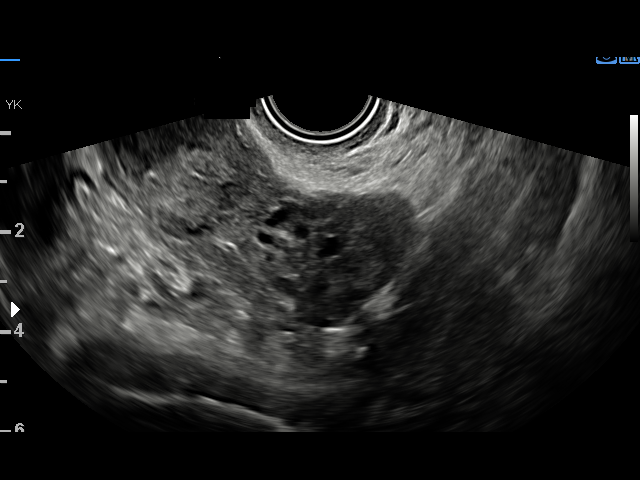

[15 of 28 positions shown; findings below may reference images not displayed]

FINDINGS: Intrauterine gestational sac: None

Maternal uterus/adnexae: Endometrial thickness measures 10 mm.
C-section scar noted in anterior lower uterine segment. No fibroids
identified.

Both ovaries are normal in appearance. No adnexal mass or abnormal
free fluid identified.
IMPRESSION: Pregnancy of unknown anatomic location (no intrauterine gestational
sac or adnexal mass identified). Differential diagnosis includes
recent spontaneous abortion, IUP too early to visualize, and
non-visualized ectopic pregnancy. Recommend correlation with serial
beta-hCG levels, and follow up US if warranted clinically.

## 2022-09-30 ENCOUNTER — Encounter (HOSPITAL_COMMUNITY): Payer: Self-pay

## 2022-09-30 ENCOUNTER — Other Ambulatory Visit: Payer: Self-pay

## 2022-09-30 ENCOUNTER — Emergency Department (HOSPITAL_COMMUNITY)
Admission: EM | Admit: 2022-09-30 | Discharge: 2022-09-30 | Disposition: A | Discharge status: Home / Self Care | Payer: Medicaid Other | Source: Home / Self Care | Attending: Student | Admitting: Student

## 2022-09-30 DIAGNOSIS — W57XXXA Bitten or stung by nonvenomous insect and other nonvenomous arthropods, initial encounter: Secondary | ICD-10-CM | POA: Insufficient documentation

## 2022-09-30 DIAGNOSIS — R21 Rash and other nonspecific skin eruption: Secondary | ICD-10-CM

## 2022-09-30 DIAGNOSIS — S50861A Insect bite (nonvenomous) of right forearm, initial encounter: Secondary | ICD-10-CM | POA: Insufficient documentation

## 2022-09-30 MED ORDER — DOXYCYCLINE HYCLATE 100 MG PO CAPS: 100.0000 mg | ORAL_CAPSULE | Freq: Two times a day (BID) | ORAL | 0 refills | Status: AC

## 2022-09-30 MED ORDER — DOXYCYCLINE HYCLATE 100 MG PO TABS
100.0000 mg | ORAL_TABLET | Freq: Once | ORAL | Status: AC
  Administered 2022-09-30: 100 mg via ORAL
  Filled 2022-09-30: qty 1

## 2022-09-30 MED ORDER — PREDNISONE 5 MG PO TABS
30.0000 mg | ORAL_TABLET | Freq: Once | ORAL | Status: AC
  Administered 2022-09-30: 30 mg via ORAL
  Filled 2022-09-30: qty 2

## 2022-09-30 MED ORDER — METHYLPREDNISOLONE 4 MG PO TBPK: ORAL_TABLET | ORAL | 0 refills | Status: AC

## 2022-09-30 NOTE — Discharge Instructions (Addendum)
Here today for bug bite.  While you are here we monitored your vital signs, performed a physical exam, and these were all reassuring and there is no indication for any further testing or intervention here in the emergency department.  We gave you a dose of an antibiotic as well as some steroids.  I additionally sent a prescription for each of these to the pharmacy on record.  Please start them soon as able to pick them up.  You can start the steroids tomorrow.  For itchiness you can take Benadryl, however it can make you sleepy so be aware of that and do not drive or drink alcohol with Benadryl.  Return to the emergency department if you have any new or worsening concerns including progression of your rash, difficulty breathing, or any other concerns.  Stop taking your Keflex.

## 2022-09-30 NOTE — ED Triage Notes (Signed)
Pt was bitten by an unknown bug on anterior of right arm 5 days ago. Pt has a red rash on arm. The rash looks like tiny blisters in a patch. Pt states it does itch.

## 2022-09-30 NOTE — ED Provider Notes (Signed)
[place] EMERGENCY DEPARTMENT AT [facility] Provider Note  MDM   HPI/ROS:  Cassie Mcconnell is a 37 y.o. female with a medical history as below who presents for evaluation of a spider bite on her right forearm.  Reports that she saw a small brown spider, and she believes that that is what bit her.  Did not actually see the spider bite her.  She initially had a small bite that had a ring around the outside and the ring has progressed and started to develop papules.  She is initially was seen at an outside outpatient provider and given a prescription for Keflex.  Shortly after starting the Keflex she started having some rash appeared on other places of her body including her neck and flank.  Does report that she feels like her eyes starting to swell as well.  Denies any nausea, vomiting, difficulty swallowing or breathing, or any other systemic complaints at this time.  Forearm insect bite is most consistent with an erythema migrans, see media below.  Suspect that the rash is likely a drug eruption secondary to taking the Keflex.  No concern for any acute airway compromise or anaphylaxis at this time.  Will give her a dose of oral prednisone as well as his first dose of doxycycline here.  Instructions to stop Keflex, start doxycycline, and send a Medrol Dosepak as well.  She needs to follow-up with her primary care in the next week, and I discussed this with her at bedside.  Disposition:  I discussed the plan for discharge with the patient and/or their surrogate at bedside prior to discharge and they were in agreement with the plan and verbalized understanding of the return precautions provided. All questions answered to the best of my ability. Ultimately, the patient was discharged in stable condition with stable vital signs. I am reassured that they are capable of close follow up and good social support at home.   Clinical Impression:  1. Insect bite of right forearm, initial  encounter   2. Rash     Rx / DC Orders ED Discharge Orders          Ordered    doxycycline (VIBRAMYCIN) 100 MG capsule  2 times daily        09/30/22 1619    methylPREDNISolone (MEDROL DOSEPAK) 4 MG TBPK tablet        09/30/22 1619            The plan for this patient was discussed with Dr. Posey Rea, who voiced agreement and who oversaw evaluation and treatment of this patient.   Clinical Complexity A medically appropriate history, review of systems, and physical exam was performed.  My independent interpretations of EKG, labs, and radiology are documented in the ED course above.   If decision rules were used in this patient's evaluation, they are listed below.   Click here for ABCD2, HEART and other calculatorsREFRESH Note before signing   Patient's presentation is most consistent with acute complicated illness / injury requiring diagnostic workup.  Medical Decision Making Risk Prescription drug management.    HPI/ROS      See MDM section for pertinent HPI and ROS. A complete ROS was performed with pertinent positives/negatives noted above.   Past Medical History:  Diagnosis Date   HPV (human papilloma virus) infection    LEEP in 2016    Past Surgical History:  Procedure Laterality Date   CESAREAN SECTION N/A 03/04/2016   Procedure: CESAREAN SECTION;  Surgeon:  Tereso Newcomer, MD;  Location: [facility];  Service: Obstetrics;  Laterality: N/A;   COLPOSCOPY     LEEP  03/2015      Physical Exam   Vitals:   09/30/22 1520 09/30/22 1527  BP: 123/78   Pulse: 66   Resp: 14   Temp: 98.2 F (36.8 C)   TempSrc: Oral   SpO2: 100%   Weight:  74.8 kg  Height:  5\' 1"  (1.549 m)    Physical Exam Vitals and nursing note reviewed.  Constitutional:      General: She is not in acute distress.    Appearance: She is well-developed.  HENT:     Head: Normocephalic and atraumatic.  Eyes:     Conjunctiva/sclera: Conjunctivae normal.  Cardiovascular:      Rate and Rhythm: Normal rate and regular rhythm.     Pulses: Normal pulses.     Heart sounds: No murmur heard. Pulmonary:     Effort: Pulmonary effort is normal. No respiratory distress.     Breath sounds: Normal breath sounds. No wheezing.  Abdominal:     Palpations: Abdomen is soft.     Tenderness: There is no abdominal tenderness.  Musculoskeletal:     Right forearm: Swelling (Insect bite with central clearing, see media below) present.     Cervical back: Neck supple.  Skin:    General: Skin is warm and dry.     Capillary Refill: Capillary refill takes less than 2 seconds.     Findings: Rash present.  Neurological:     Mental Status: She is alert.  Psychiatric:        Mood and Affect: Mood normal.         Procedures   If procedures were preformed on this patient, they are listed below:  Procedures   Fayrene Helper, MD Emergency Medicine PGY-2   Please note that this documentation was produced with the assistance of voice-to-text technology and may contain errors.    Fayrene Helper, MD 09/30/22 1656    Glendora Score, MD 10/01/22 1240
